# Patient Record
Sex: Female | Born: 2000 | Race: White | Hispanic: No | Smoking: Never smoker
Health system: Southern US, Community
[De-identification: ages and names within clinical notes are randomized; demographics above are authoritative.]

## PROBLEM LIST (undated history)

## (undated) HISTORY — PX: WISDOM TOOTH EXTRACTION: SHX21

---
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
## 2021-01-05 ENCOUNTER — Other Ambulatory Visit: Payer: Self-pay | Admitting: Internal Medicine

## 2021-01-05 DIAGNOSIS — F909 Attention-deficit hyperactivity disorder, unspecified type: Secondary | ICD-10-CM

## 2021-01-05 MED ORDER — LISDEXAMFETAMINE DIMESYLATE 70 MG PO CAPS: ORAL_CAPSULE | ORAL | 0 refills | Status: DC

## 2021-01-12 ENCOUNTER — Telehealth: Payer: Self-pay

## 2021-01-12 NOTE — Telephone Encounter (Signed)
-----   Message from Elder Negus, NP sent at 01/12/2021 12:36 PM EST ----- Regarding: RE: Prior auth Please let patient know.  Sincerely,           Elder Negus, NP   ----- Message ----- From: Gregery Na, CMA Sent: 01/12/2021  12:01 PM EST To: Elder Negus, NP Subject: Prior Berkley Harvey                                     P/A for [facility] has been DENIED/ [employer] Reference #: [id number]

## 2021-01-12 NOTE — Telephone Encounter (Signed)
Patient does not have a VM box set up at this time. Unable to inform pt that insurance will not cover VYVANSE/ P/A---denied. Unable to LVM to have patient return call as well

## 2021-01-15 DIAGNOSIS — Z118 Encounter for screening for other infectious and parasitic diseases: Secondary | ICD-10-CM | POA: Diagnosis not present

## 2021-01-15 DIAGNOSIS — Z6821 Body mass index (BMI) 21.0-21.9, adult: Secondary | ICD-10-CM | POA: Diagnosis not present

## 2021-01-15 DIAGNOSIS — R350 Frequency of micturition: Secondary | ICD-10-CM | POA: Diagnosis not present

## 2021-01-15 DIAGNOSIS — Z01419 Encounter for gynecological examination (general) (routine) without abnormal findings: Secondary | ICD-10-CM | POA: Diagnosis not present

## 2021-01-29 ENCOUNTER — Other Ambulatory Visit (HOSPITAL_BASED_OUTPATIENT_CLINIC_OR_DEPARTMENT_OTHER): Payer: Self-pay | Admitting: Nurse Practitioner

## 2021-01-29 DIAGNOSIS — N3000 Acute cystitis without hematuria: Secondary | ICD-10-CM

## 2021-01-29 MED ORDER — FLUCONAZOLE 150 MG PO TABS
150.0000 mg | ORAL_TABLET | Freq: Every day | ORAL | 1 refills | Status: DC
Start: 2021-01-29 — End: 2021-03-12

## 2021-01-29 MED ORDER — NITROFURANTOIN MONOHYD MACRO 100 MG PO CAPS: 100.0000 mg | ORAL_CAPSULE | Freq: Two times a day (BID) | ORAL | 0 refills | Status: DC

## 2021-01-30 ENCOUNTER — Telehealth: Payer: Self-pay

## 2021-01-30 NOTE — Telephone Encounter (Signed)
-----  Message from Liane Comber, NP sent at 01/29/2021  6:03 PM EST ----- Regarding: RE: phone message Please advise macrobid and diflucan were sent in by Jacolyn Reedy, NP - [facility] on 01/29/2021. Should be able to pick up anytime at her convenience.   ----- Message ----- From: Elenor Quinones, CMA Sent: 01/29/2021   4:02 PM EST To: Liane Comber, NP, Chancy Hurter, CMA Subject: phone message                                  Used at home UTI kit which was POS.  Last seen 12/23/2020--ash cor   Today visit with Teresa EARLY,NP- [facility]- for ACUTE CYSTITIS-------(FYI) Saw this is computer   Patient has requested Rx sent to pharmacy.  Next office visit: 06/10/2021 was w/ K.M

## 2021-01-30 NOTE — Telephone Encounter (Signed)
Pt has picked up meds

## 2021-03-11 NOTE — Progress Notes (Signed)
Assessment and Plan:  Teresa Olson was seen today for medication management.  Diagnoses and all orders for this visit:  Labile mood Depression, unspecified depression type Family history of bipolar disorder With increased labile mood with lexapro, insomnia Strong family history of bipolar is shared today, suspect this may be the case for her After discussion, taper off of lexapro with 5 mg x 1 week then stop Then trial vraylar 1.5 mg daily x 2 weeks and follow up , coupon given  Stress management techniques discussed, increase water, good sleep hygiene discussed, increase exercise, and increase veggies.  Call the office if any new AE's from medications and we will switch them -     cariprazine (VRAYLAR) capsule; Take 1 capsule (1.5 mg total) by mouth daily.  Further disposition pending results of labs. Discussed med's effects and SE's.   Over 15 minutes of exam, counseling, chart review, and critical decision making was performed.   Future Appointments  Date Time Provider Department Center  05/12/2021  4:00 PM Judd Gaudier, NP [facility] None  06/10/2021  3:30 PM Elder Negus, NP [facility] None  12/10/2021  2:00 PM McClanahan, Bella Kennedy, NP [facility] None    ------------------------------------------------------------------------------------------------------------------   HPI BP 124/72   Pulse (!) 134   Temp (!) 97.3 F (36.3 C)   Wt 136 lb (61.7 kg)   SpO2 94%   BMI 22.63 kg/m   19 y.o.female presents for follow up of depression.    She was evaluated for depressed mood and poor motivation 2 months ago, ongoing for many years, but worse with the college she was out (currently at [employer], studying radiography), and also when grandmother passed this past year. Parents are not supportive, treats like "out of sight, out of mind." She came in with her girlfriend now that she is 39 y/o. Initated on lexapro 10 mg, reports immediately had sedation and started taking at night, but then  noted after 2 weeks noted she was having trouble sleeping, has been waking up hourly. Hasn't noted any improvement with mood. Notes very labile, "all over the place." Will very quickly snap and make statements she doesn't mean.   She notes today sister is bipolar, reports strong family hx.   Denies ETOH, drugs Has had some SI without specific plan; girlfriend spends extensive time with her and monitoring closely  Recently had normal labs at [facility] 12/10/2020, CBC, CMP, TSH  She has been on vyvanse 70 mg daily for ADD, works well, unchanged.   Patient is on Vitamin D supplement.   Lab Results  Component Value Date   VD25OH 46 12/25/2019       No past medical history on file.   Allergies  Allergen Reactions  . Wound Dressing Adhesive Rash  . Sulfa Antibiotics     Current Outpatient Medications on File Prior to Visit  Medication Sig  . BLISOVI 24 FE 1-20 MG-MCG(24) tablet   . Cholecalciferol (VITAMIN D3) 125 MCG (5000 UT) CAPS Takes 1 capsule Daily  . Cyanocobalamin (VITAMIN B-12) 1000 MCG SUBL Takes 1 tablet SL Daily  . fexofenadine (ALLEGRA) 180 MG tablet Take 180 mg by mouth daily.  Marland Kitchen lisdexamfetamine (VYVANSE) 70 MG capsule Take  1 capsule    in the Mornings of Work or School Days for ADD, Focus & Concentration & try limit to 5 days  /week to avoid Addiction  . nitrofurantoin, macrocrystal-monohydrate, (MACROBID) 100 MG capsule Take 1 capsule (100 mg total) by mouth 2 (two) times daily.   No current facility-administered  medications on file prior to visit.    ROS: all negative except above.   Physical Exam:  BP 124/72   Pulse (!) 134   Temp (!) 97.3 F (36.3 C)   Wt 136 lb (61.7 kg)   SpO2 94%   BMI 22.63 kg/m   General Appearance: Well nourished, in no apparent distress. Eyes: PERRL, conjunctiva no swelling or erythema ENT/Mouth: Mask in place; Hearing normal.  Neck: Supple, thyroid  Respiratory: Respiratory effort normal Cardio:  Appears well  perfused Musculoskeletal:  normal gait.  Skin: Warm, dry without rashes, lesions, ecchymosis.  Neuro: Normal muscle tone Psych: Awake and oriented X 3, hyper, fidgeting, mildly pressured speech, Insight and Judgment appropriate.     Dan Maker, NP 5:49 PM [facility]

## 2021-03-12 ENCOUNTER — Encounter: Payer: Self-pay | Admitting: Adult Health

## 2021-03-12 ENCOUNTER — Other Ambulatory Visit: Payer: Self-pay

## 2021-03-12 ENCOUNTER — Ambulatory Visit: Payer: BC Managed Care – PPO | Admitting: Adult Health

## 2021-03-12 VITALS — BP 124/72 | HR 134 | Temp 97.3°F | Wt 136.0 lb

## 2021-03-12 DIAGNOSIS — Z6821 Body mass index (BMI) 21.0-21.9, adult: Secondary | ICD-10-CM

## 2021-03-12 DIAGNOSIS — F32A Depression, unspecified: Secondary | ICD-10-CM | POA: Diagnosis not present

## 2021-03-12 DIAGNOSIS — R4586 Emotional lability: Secondary | ICD-10-CM

## 2021-03-12 DIAGNOSIS — E559 Vitamin D deficiency, unspecified: Secondary | ICD-10-CM

## 2021-03-12 DIAGNOSIS — F909 Attention-deficit hyperactivity disorder, unspecified type: Secondary | ICD-10-CM

## 2021-03-12 DIAGNOSIS — Z818 Family history of other mental and behavioral disorders: Secondary | ICD-10-CM | POA: Diagnosis not present

## 2021-03-12 DIAGNOSIS — F321 Major depressive disorder, single episode, moderate: Secondary | ICD-10-CM

## 2021-03-12 MED ORDER — CARIPRAZINE HCL 1.5 MG PO CAPS: 1.5000 mg | ORAL_CAPSULE | Freq: Every day | ORAL | 0 refills | Status: DC

## 2021-03-12 NOTE — Patient Instructions (Addendum)
Mixed Bipolar Disorder Mixed bipolar disorder is a mental health disorder in which a person has episodes of emotional highs (mania), lows (depression), or both of these feelings at the same time. People with this disorder have very big mood changes (mood swings) that happen quickly on a regular basis. These episodes may be severe enough to cause problems with relationships, school, or work. In some cases, they can cause the person to be unsafe, and the person may need to be hospitalized. What are the causes? The cause of this condition is not known. What increases the risk? The following factors may make you more likely to develop this condition:  Having a family history of the disorder.  Abusing substances such as alcohol or drugs.  Having an anxiety disorder.  Having another illness, such as heart disease or thyroid disease. What are the signs or symptoms? Symptoms of this condition include having episodes of mania, depression, and sometimes symptoms of both at the same time. For instance, you may feel sad and full of energy at the same time. You may have mood swings almost every day. Symptoms of mania may include:  Very high self-esteem or self-confidence.  Being unusually talkative, or feeling a need to keep talking. Speech may be very fast. It may seem like you cannot stop talking.  Racing thoughts or constant talking, with quick shifts between topics that may or may not be related (flight of ideas).  Decreased ability to focus or concentrate.  Increased purposeful activity, such as work, study, or social activity.  Increased nonproductive activity. This could be pacing, squirming and fidgeting, or finger and toe tapping.  Impulsive behavior and poor judgment. This may result in high-risk activities, such as having unprotected sex or spending a lot of money. Symptoms of depression may include:  Feeling sad, hopeless, or helpless.  Lack of feeling or caring about  anything.  Not being able to enjoy things that you used to enjoy.  Trouble concentrating or remembering.  Trouble making decisions.  Thoughts of death, or desire to harm yourself. How is this diagnosed? This condition may be diagnosed based on:  Your symptoms and medical history. Your health care provider will ask about your emotional episodes.  A physical exam. This is done to rule out any health problems that may be causing symptoms. Your health care provider will also ask about your alcohol and drug use. How is this treated? Bipolar disorder is a long-term (chronic) illness. It is best controlled with treatment that is given on an ongoing basis, rather than only when symptoms are present. Treatment may include:  Medicine. Medicine can be prescribed by a provider who specializes in treating mental disorders (psychiatrist). ? Medicines called mood stabilizers, antipsychotics, or antidepressants may be prescribed. ? If symptoms occur even while one type of medicine is taken, other medicines may be added.  Psychotherapy. Some forms of talk therapy, such as cognitive behavioral therapy (CBT), can provide support, education, and guidance.  Coping methods, such as journaling or relaxation exercises. These may include: ? Yoga. ? Meditation. ? Deep breathing.  Lifestyle changes, such as: ? Limiting alcohol and drug use. ? Exercising regularly. ? Getting plenty of sleep. ? Making healthy eating choices. A combination of medicine, talk therapy, and coping methods is best. In severe cases, if other treatments do not work, a procedure may be used to change the brain chemicals that send messages between brain cells (neurotransmitters). This procedure, called electroconvulsive therapy (ECT), applies short electrical pulses to  the brain through the scalp.   Follow these instructions at home: Activity  Return to your normal activities as told by your health care provider.  Find activities  that you enjoy, and make time to do them.  Exercise regularly as told by your health care provider.   Lifestyle  Follow a set schedule for eating and sleeping.  Eat a balanced diet that includes fresh fruits and vegetables, whole grains, low-fat dairy products, and lean meats.  Get 7-8 or more hours of sleep each night.  Do not drink alcohol or use illegal drugs. General instructions  Take over-the-counter and prescription medicines only as told by your health care provider.  Think about joining a support group. Your health care provider may be able to recommend a group for you.  Talk with your family and loved ones about your treatment goals and about how they can help.  Keep all follow-up visits as told by your health care provider. This is important. Contact a health care provider if:  Your symptoms get worse.  You have side effects from your medicine.  You have trouble sleeping.  You have trouble doing daily activities.  You feel unsafe in your surroundings.  You are dealing with substance abuse. Get help right away if:  You think about hurting yourself or you try to hurt yourself.  You think about suicide. If you ever feel like you may hurt yourself or others, or have thoughts about taking your own life, get help right away. You can go to your nearest emergency department or call:  Your local emergency services (911 in the [place]  A suicide crisis helpline, such as the National Suicide Prevention Lifeline at (405)801-02591-(647)586-2124. This is open 24 hours a day. Summary  Mixed bipolar disorder is a mental health disorder in which a person has episodes of emotional highs (mania), lows (depression), or both of these feelings at the same time.  Bipolar disorder is a long-term (chronic) illness. It is best controlled with treatment that is given on an ongoing basis, rather than only when symptoms are present.  A combination of medicine, talk therapy, and coping methods is  best for treating this condition. This information is not intended to replace advice given to you by your health care provider. Make sure you discuss any questions you have with your health care provider. Document Revised: 05/14/2020 Document Reviewed: 05/14/2020 Elsevier Patient Education  2021 Elsevier Inc.         Cariprazine Oral Capsules What is this medicine? CARIPRAZINE (car i PRA zeen) is an antipsychotic. It is used to treat schizophrenia or bipolar disorder. Bipolar disorder is also known as manic-depression. This medicine may be used for other purposes; ask your health care provider or pharmacist if you have questions. COMMON BRAND NAME(S): VRAYLAR What should I tell my health care provider before I take this medicine? They need to know if you have any of these conditions:  dementia  diabetes  difficulty swallowing  have trouble controlling your muscles  heart disease  high cholesterol  history of breast cancer  history of stroke  kidney disease  liver disease  low blood counts, like low white cell, platelet, or red cell counts  low blood pressure  Parkinson's disease  seizures  suicidal thoughts, plans or attempt; a previous suicide attempt by you or a family member  an unusual or allergic reaction to cariprazine, other medicines, foods, dyes, or preservatives  pregnant or trying to get pregnant  breast-feeding How should I  use this medicine? Take this medicine by mouth with a glass of water. Follow the directions on the prescription label. You may take it with or without food. Take your medicine at regular intervals. Do not take it more often than directed. Do not stop taking except on your doctor's advice. A special MedGuide will be given to you by the pharmacist with each prescription and refill. Be sure to read this information carefully each time. Talk to your pediatrician regarding the use of this medicine in children. Special care may be  needed. Overdosage: If you think you have taken too much of this medicine contact a poison control center or emergency room at once. NOTE: This medicine is only for you. Do not share this medicine with others. What if I miss a dose? If you miss a dose, take it as soon as you can. If it is almost time for your next dose, take only that dose. Do not take double or extra doses. What may interact with this medicine? Do not take this medicine with any of the following medications:  metoclopramide This medicine may also interact with the following medications:  antihistamines for allergy, cough, and cold  carbamazepine  certain medicines for anxiety or sleep  certain medicines for depression like amitriptyline, fluoxetine, sertraline  certain medicines for fungal infections like itraconazole, ketoconazole  general anesthetics like halothane, isoflurane, methoxyflurane, propofol  levodopa or other medicines for Parkinson's disease  medicines for blood pressure  medicines for seizures  medicines that relax muscles for surgery  narcotic medicines for pain  phenothiazines like chlorpromazine, prochlorperazine, thioridazine  rifampin This list may not describe all possible interactions. Give your health care provider a list of all the medicines, herbs, non-prescription drugs, or dietary supplements you use. Also tell them if you smoke, drink alcohol, or use illegal drugs. Some items may interact with your medicine. What should I watch for while using this medicine? Visit your health care professional for regular checks on your progress. Tell your health care professional if symptoms do not start to get better or if they get worse. Do not stop taking except on your health care professional's advice. You may develop a severe reaction. Your health care professional will tell you how much medicine to take. Patients and their families should watch out for new or worsening depression or  thoughts of suicide. Also watch out for sudden changes in feelings such as feeling anxious, agitated, panicky, irritable, hostile, aggressive, impulsive, severely restless, overly excited and hyperactive, or not being able to sleep. If this happens, especially at the beginning of treatment or after a change in dose, call your healthcare professional. Bonita Quin may get dizzy or drowsy. Do not drive, use machinery, or do anything that needs mental alertness until you know how this medicine affects you. Do not stand or sit up quickly, especially if you are an older patient. This reduces the risk of dizzy or fainting spells. Alcohol may interfere with the effect of this medicine. Avoid alcoholic drinks. This medicine may cause dry eyes and blurred vision. If you wear contact lenses you may feel some discomfort. Lubricating drops may help. See your eye doctor if the problem does not go away or is severe. This medicine may increase blood sugar. Ask your health care provider if changes in diet or medicines are needed if you have diabetes. This drug can cause problems with controlling your body temperature. It can lower the response of your body to cold temperatures. If possible, stay indoors  during cold weather. If you must go outdoors, wear warm clothes. It can also lower the response of your body to heat. Do not overheat. Do not over-exercise. Stay out of the sun when possible. If you must be in the sun, wear cool clothing. Drink plenty of water. If you have trouble controlling your body temperature, call your health care provider right away. Women should inform their doctor if they wish to become pregnant or think they might be pregnant. The effects of this medicine on an unborn child are not known. A registry is available to monitor pregnancy outcomes in pregnant women exposed to this medicine or similar medicines. Talk to your health care professional or pharmacist for more information. What side effects may I notice  from receiving this medicine? Side effects that you should report to your doctor or health care professional as soon as possible:  allergic reactions like skin rash, itching or hives, swelling of the face, lips, or tongue  breathing problems  confusion  fever or chills, sore throat  inability to keep still  problems with balance, talking, walking  redness, blistering, peeling or loosening of the skin, including inside the mouth  seizures  signs and symptoms of high blood sugar such as being more thirsty or hungry or having to urinate more than normal. You may also feel very tired or have blurry vision  signs and symptoms of low blood pressure like dizziness; feeling faint or lightheaded, falls; unusually weak or tired  signs and symptoms of neuroleptic malignant syndrome such as confusion; fast or irregular heartbeat; high fever; increased sweating; stiff muscles  sudden numbness or weakness of the face, arm, or leg  suicidal thoughts or other mood changes  trouble swallowing  uncontrollable movements of the arms, face, head, mouth, neck, or upper body Side effects that usually do not require medical attention (report to your doctor or health care professional if they continue or are bothersome):  constipation  drowsiness  nausea, vomiting  upset stomach  weight gain This list may not describe all possible side effects. Call your doctor for medical advice about side effects. You may report side effects to FDA at 1-800-FDA-1088. Where should I keep my medicine? Keep out of the reach of children. Store at room temperature between 15 and 30 degrees C (59 and 86 degrees F). Protect from light. Throw away any unused medicine after the expiration date. NOTE: This sheet is a summary. It may not cover all possible information. If you have questions about this medicine, talk to your doctor, pharmacist, or health care provider.  2021 Elsevier/Gold Standard (2019-10-11  15:13:10)

## 2021-03-17 ENCOUNTER — Other Ambulatory Visit: Payer: Self-pay | Admitting: Adult Health

## 2021-03-17 MED ORDER — FLUOXETINE HCL 10 MG PO CAPS
10.0000 mg | ORAL_CAPSULE | Freq: Every day | ORAL | 0 refills | Status: DC
Start: 2021-03-17 — End: 2021-04-10

## 2021-03-19 ENCOUNTER — Other Ambulatory Visit: Payer: Self-pay

## 2021-03-19 DIAGNOSIS — F909 Attention-deficit hyperactivity disorder, unspecified type: Secondary | ICD-10-CM

## 2021-03-20 MED ORDER — LISDEXAMFETAMINE DIMESYLATE 70 MG PO CAPS: ORAL_CAPSULE | ORAL | 0 refills | Status: DC

## 2021-03-25 ENCOUNTER — Other Ambulatory Visit: Payer: Self-pay | Admitting: Adult Health

## 2021-03-31 ENCOUNTER — Other Ambulatory Visit: Payer: Self-pay | Admitting: Adult Health

## 2021-03-31 DIAGNOSIS — F909 Attention-deficit hyperactivity disorder, unspecified type: Secondary | ICD-10-CM

## 2021-03-31 MED ORDER — LISDEXAMFETAMINE DIMESYLATE 70 MG PO CAPS: ORAL_CAPSULE | ORAL | 0 refills | Status: DC

## 2021-03-31 MED ORDER — METHYLPHENIDATE HCL ER (OSM) 27 MG PO TBCR: 27.0000 mg | EXTENDED_RELEASE_TABLET | Freq: Every day | ORAL | 0 refills | Status: DC | PRN

## 2021-03-31 NOTE — Progress Notes (Signed)
Future Appointments  Date Time Provider Department Center  05/12/2021  4:00 PM Judd Gaudier, NP [facility] None  06/10/2021  3:30 PM Elder Negus, NP [facility] None  12/10/2021  2:00 PM Elder Negus, NP [facility] None   PDMP reviewed for vyvanse refill request.

## 2021-04-09 ENCOUNTER — Ambulatory Visit: Payer: BC Managed Care – PPO

## 2021-04-10 ENCOUNTER — Other Ambulatory Visit: Payer: Self-pay | Admitting: Adult Health

## 2021-04-30 ENCOUNTER — Other Ambulatory Visit: Payer: Self-pay | Admitting: Adult Health

## 2021-04-30 MED ORDER — FLUOXETINE HCL 20 MG PO CAPS: 20.0000 mg | ORAL_CAPSULE | Freq: Every day | ORAL | 1 refills | Status: DC

## 2021-05-01 ENCOUNTER — Other Ambulatory Visit: Payer: Self-pay

## 2021-05-01 DIAGNOSIS — F909 Attention-deficit hyperactivity disorder, unspecified type: Secondary | ICD-10-CM

## 2021-05-02 ENCOUNTER — Other Ambulatory Visit: Payer: Self-pay | Admitting: Adult Health

## 2021-05-02 DIAGNOSIS — F909 Attention-deficit hyperactivity disorder, unspecified type: Secondary | ICD-10-CM

## 2021-05-02 MED ORDER — FLUOXETINE HCL 10 MG PO CAPS: 10.0000 mg | ORAL_CAPSULE | Freq: Every day | ORAL | 1 refills | Status: DC

## 2021-05-02 MED ORDER — METHYLPHENIDATE HCL ER (OSM) 36 MG PO TBCR: 36.0000 mg | EXTENDED_RELEASE_TABLET | Freq: Every day | ORAL | 0 refills | Status: DC | PRN

## 2021-05-02 NOTE — Progress Notes (Signed)
Future Appointments  Date Time Provider Department Center  05/12/2021  4:00 PM Judd Gaudier, NP [facility] None  06/10/2021  3:30 PM Elder Negus, NP [facility] None  12/10/2021  2:00 PM Elder Negus, NP [facility] None    PDMP reviewed for concerta refill request.

## 2021-05-08 ENCOUNTER — Other Ambulatory Visit (HOSPITAL_BASED_OUTPATIENT_CLINIC_OR_DEPARTMENT_OTHER): Payer: Self-pay | Admitting: Nurse Practitioner

## 2021-05-08 DIAGNOSIS — B3731 Acute candidiasis of vulva and vagina: Secondary | ICD-10-CM

## 2021-05-08 DIAGNOSIS — B373 Candidiasis of vulva and vagina: Secondary | ICD-10-CM

## 2021-05-08 MED ORDER — FLUCONAZOLE 150 MG PO TABS: ORAL_TABLET | ORAL | 1 refills | Status: DC

## 2021-05-08 NOTE — Progress Notes (Signed)
Assessment and Plan:  Teresa Olson was seen today for follow-up.  Diagnoses and all orders for this visit:  Attention deficit hyperactivity disorder (ADHD), unspecified ADHD type Continue medications: concerta 36 mg daily Helps with focus, no AE's. The patient was counseled on the addictive nature of the medication and was encouraged to take drug holidays when not needed.   Depression, unspecified depression type Will try next dose up to see benefit 10 mg vs 20 mg; send in higher dose if perceives benefit and tolerating well.  Lifestyle discussed: diet/exerise, sleep hygiene, stress management, hydration -     FLUoxetine (PROZAC) 20 MG capsule; Take 1 capsule (20 mg total) by mouth daily.   Further disposition pending results of labs. Discussed med's effects and SE's.   Over 15 minutes of exam, counseling, chart review, and critical decision making was performed.   Future Appointments  Date Time Provider Department Center  12/10/2021  2:00 PM McClanahan, Bella Kennedy, NP [facility] None    ------------------------------------------------------------------------------------------------------------------   HPI BP 118/60   Pulse 93   Temp (!) 97.5 F (36.4 C)   Wt 143 lb (64.9 kg)   SpO2 99%   BMI 23.80 kg/m   20 y.o.female presents for follow up of depression and ADD.    She was evaluated for depressed mood and poor motivation in early 2022, ongoing for many years, but worse with the college she was out (currently at [employer], studying radiography, doing prereq), and also when grandmother passed in 2021. Parents are not supportive, treats like "out of sight, out of mind." She came in with her girlfriend now that she is 20 y/o. Initated on lexapro 10 mg, reports immediately had sedation/insomnia, switched to prozac 10 mg and today she reports much improved mood for the most part, less labile though does still have some down days, denies SE.   Also previously on vyvanse 70 mg, was swtiched to  concerta due to insurance coverage issue, now up to 36 mg daily, reports works very well.   Denies ETOH, drugs Denies recent SI.   BMI is Body mass index is 23.8 kg/m., she has been working on diet and exercise, does "just dance" 30 min a few days a week.  Does try to make good choices, but difficult when at dad's house, lots of processed.  Wt Readings from Last 3 Encounters:  05/12/21 143 lb (64.9 kg) (76 %, Z= 0.70)*  03/12/21 136 lb (61.7 kg) (68 %, Z= 0.46)*  12/23/20 131 lb (59.4 kg) (61 %, Z= 0.28)*   * Growth percentiles are based on CDC (Girls, 2-20 Years) data.   Patient is on Vitamin D supplement.   Lab Results  Component Value Date   VD25OH 46 12/25/2019     Recently had normal labs at [facility] 12/10/2020, CBC, CMP, TSH    No past medical history on file.   Allergies  Allergen Reactions  . Wound Dressing Adhesive Rash  . Sulfa Antibiotics     Current Outpatient Medications on File Prior to Visit  Medication Sig  . BLISOVI 24 FE 1-20 MG-MCG(24) tablet   . Cyanocobalamin (VITAMIN B-12) 1000 MCG SUBL Takes 1 tablet SL Daily  . fexofenadine (ALLEGRA) 180 MG tablet Take 180 mg by mouth daily.  . methylphenidate (CONCERTA) 36 MG PO CR tablet Take 1 tablet (36 mg total) by mouth daily as needed. For mood and focus. Try to limit to <5 days/week avoid tolerance.  . Cholecalciferol (VITAMIN D3) 125 MCG (5000 UT) CAPS Takes 1 capsule  Daily (Patient not taking: 19/31/2022)   No current facility-administered medications on file prior to visit.    ROS: all negative except above.   Physical Exam:  BP 118/60   Pulse 93   Temp (!) 97.5 F (36.4 C)   Wt 143 lb (64.9 kg)   SpO2 99%   BMI 23.80 kg/m   General Appearance: Well nourished, in no apparent distress. Eyes: PERRL, conjunctiva no swelling or erythema ENT/Mouth: Mask in place; Hearing normal.  Neck: Supple, thyroid  Respiratory: Respiratory effort normal Cardio:  Appears well perfused Musculoskeletal:   normal gait.  Skin: Warm, dry without rashes, lesions, ecchymosis.  Neuro: Normal muscle tone Psych: Awake and oriented X 3, pleasant, normal affect, Insight and Judgment appropriate.     Dan Maker, NP 4:42 PM [facility]

## 2021-05-12 ENCOUNTER — Ambulatory Visit: Payer: BC Managed Care – PPO | Admitting: Adult Health

## 2021-05-12 ENCOUNTER — Other Ambulatory Visit: Payer: Self-pay

## 2021-05-12 ENCOUNTER — Encounter: Payer: Self-pay | Admitting: Adult Health

## 2021-05-12 VITALS — BP 118/60 | HR 93 | Temp 97.5°F | Wt 143.0 lb

## 2021-05-12 DIAGNOSIS — Z79899 Other long term (current) drug therapy: Secondary | ICD-10-CM | POA: Diagnosis not present

## 2021-05-12 DIAGNOSIS — F909 Attention-deficit hyperactivity disorder, unspecified type: Secondary | ICD-10-CM | POA: Diagnosis not present

## 2021-05-12 DIAGNOSIS — F32A Depression, unspecified: Secondary | ICD-10-CM

## 2021-05-12 DIAGNOSIS — E559 Vitamin D deficiency, unspecified: Secondary | ICD-10-CM

## 2021-05-12 MED ORDER — FLUOXETINE HCL 20 MG PO CAPS: 20.0000 mg | ORAL_CAPSULE | Freq: Every day | ORAL | 1 refills | Status: DC

## 2021-05-26 ENCOUNTER — Other Ambulatory Visit: Payer: Self-pay

## 2021-05-26 DIAGNOSIS — F909 Attention-deficit hyperactivity disorder, unspecified type: Secondary | ICD-10-CM

## 2021-05-26 MED ORDER — METHYLPHENIDATE HCL ER (OSM) 36 MG PO TBCR: 36.0000 mg | EXTENDED_RELEASE_TABLET | Freq: Every day | ORAL | 0 refills | Status: DC | PRN

## 2021-06-10 ENCOUNTER — Ambulatory Visit: Payer: BC Managed Care – PPO | Admitting: Adult Health Nurse Practitioner

## 2021-06-29 ENCOUNTER — Other Ambulatory Visit: Payer: Self-pay

## 2021-06-29 DIAGNOSIS — F909 Attention-deficit hyperactivity disorder, unspecified type: Secondary | ICD-10-CM

## 2021-06-30 MED ORDER — METHYLPHENIDATE HCL ER (OSM) 36 MG PO TBCR: 36.0000 mg | EXTENDED_RELEASE_TABLET | Freq: Every day | ORAL | 0 refills | Status: DC | PRN

## 2021-09-03 ENCOUNTER — Other Ambulatory Visit: Payer: Self-pay

## 2021-09-03 DIAGNOSIS — F909 Attention-deficit hyperactivity disorder, unspecified type: Secondary | ICD-10-CM

## 2021-09-04 MED ORDER — METHYLPHENIDATE HCL ER (OSM) 36 MG PO TBCR: 36.0000 mg | EXTENDED_RELEASE_TABLET | Freq: Every day | ORAL | 0 refills | Status: DC | PRN

## 2021-11-10 ENCOUNTER — Encounter: Payer: Self-pay | Admitting: Adult Health

## 2021-11-10 ENCOUNTER — Other Ambulatory Visit: Payer: Self-pay | Admitting: Adult Health

## 2021-11-10 DIAGNOSIS — F32A Depression, unspecified: Secondary | ICD-10-CM

## 2021-11-10 MED ORDER — FLUOXETINE HCL 20 MG PO CAPS: 20.0000 mg | ORAL_CAPSULE | Freq: Every day | ORAL | 1 refills | Status: DC

## 2021-11-23 ENCOUNTER — Other Ambulatory Visit: Payer: Self-pay | Admitting: Nurse Practitioner

## 2021-11-23 DIAGNOSIS — F909 Attention-deficit hyperactivity disorder, unspecified type: Secondary | ICD-10-CM

## 2021-11-24 MED ORDER — METHYLPHENIDATE HCL ER (OSM) 36 MG PO TBCR: 36.0000 mg | EXTENDED_RELEASE_TABLET | Freq: Every day | ORAL | 0 refills | Status: DC | PRN

## 2021-12-09 NOTE — Progress Notes (Deleted)
COMPLETE PHSYICAL   Assessment and Plan:  Encounter for general adult medical examination with abnormal findings Yearly  Attention deficit hyperactivity disorder (ADHD), unspecified ADHD type -Vyvance 70mg   Coupon card given to patient and discussed.  Vitamin D deficiency Continue supplementation to maintain goal of 70-100 Taking Vitamin D 5,000IU daily -     VITAMIN D 25 Hydroxy (Vit-D Deficiency, Fractures)  Raynaud's disease without gangrene Stay warm, can use cream as needed Call if worsening symptoms or new symptoms.   Medication management - Magnesium  Hyperlipidemia Continue diet and exercise -     Lipid panel - CBC - CMP  Screening for hematuria or proteinuria -     Urinalysis, Routine w reflex microscopic -     Microalbumin / creatinine urine ratio  Abnormal Glucose Continue diet and exercise -     Hemoglobin A1c     Discussed med's effects and SE's. Screening labs and tests as requested with regular follow-up as recommended. Over 40 minutes of face to face interview,  exam, counseling, chart review, and complex, high level critical decision making was performed this visit.   HPI  20 y.o. female  Presents for complete physical and for has Raynaud's disease without gangrene; Attention deficit hyperactivity disorder (ADHD); Vitamin D deficiency; Medication management; and Depressed on their problem list..  Her blood pressure has been controlled at home, today their BP is   She does not workout. She denies chest pain, shortness of breath, dizziness.   She is in 12th grade this year, does not want to go to college, but will go, likes music, does not know what she wants.    She follows with GYN and is on BCP for irregular menses, she sexually active.  One partner, female. Reports use of external / internal stimulators. Discussed hygiene.  She gets hives for years, take zyrtec which helps. She has DX of raynauds, has not had ulcers or rash on her feet lately.  Has not had to use the NTG given.   BMI is There is no height or weight on file to calculate BMI., she is working on diet and exercise. Wt Readings from Last 3 Encounters:  05/12/21 143 lb (64.9 kg) (76 %, Z= 0.70)*  03/12/21 136 lb (61.7 kg) (68 %, Z= 0.46)*  12/23/20 131 lb (59.4 kg) (61 %, Z= 0.28)*   * Growth percentiles are based on CDC (Girls, 2-20 Years) data.     Current Medications:  Current Outpatient Medications on File Prior to Visit  Medication Sig Dispense Refill   BLISOVI 24 FE 1-20 MG-MCG(24) tablet   9   Cholecalciferol (VITAMIN D3) 125 MCG (5000 UT) CAPS Takes 1 capsule Daily (Patient not taking: Reported on 05/12/2021) 1 capsule 0   Cyanocobalamin (VITAMIN B-12) 1000 MCG SUBL Takes 1 tablet SL Daily 1 tablet 0   fexofenadine (ALLEGRA) 180 MG tablet Take 180 mg by mouth daily.     FLUoxetine (PROZAC) 20 MG capsule Take 1 capsule (20 mg total) by mouth daily. 90 capsule 1   methylphenidate (CONCERTA) 36 MG PO CR tablet Take 1 tablet (36 mg total) by mouth daily as needed. For mood and focus. Try to limit to <5 days/week avoid tolerance. 30 tablet 0   No current facility-administered medications on file prior to visit.   Allergies:  Allergies  Allergen Reactions   Wound Dressing Adhesive Rash   Sulfa Antibiotics    Medical History:  She has Raynaud's disease without gangrene; Attention deficit hyperactivity disorder (ADHD);  Vitamin D deficiency; Medication management; and Depressed on their problem list.   Health Maintenance:   Immunization History  Administered Date(s) Administered   HPV 9-valent 10/06/2020   Meningococcal Conjugate 07/31/2019   PFIZER(Purple Top)SARS-COV-2 Vaccination 03/17/2020, 04/09/2020   WILL GET IMMUNIZATION LIST  Tetanus:  Pneumovax: Prevnar 13:  Flu vaccine: Zostavax: HPV: Due for 2nd & 3rd doses, [facility]. Provided written prescription  Patient Care Team: Lucky Cowboy, MD as PCP - General (Internal  Medicine)  Surgical History:  She has no past surgical history on file. Family History:  Herfamily history includes Bipolar disorder in her sister; Cancer in her maternal grandmother; Heart disease in her sister; Rheumatologic disease in her paternal grandfather. Social History:  She reports that she has never smoked. She has never used smokeless tobacco. She reports that she does not drink alcohol and does not use drugs.  Review of Systems: Review of Systems  Constitutional: Negative.   HENT: Negative.    Eyes: Negative.   Respiratory: Negative.    Cardiovascular: Negative.   Gastrointestinal: Negative.   Genitourinary: Negative.   Musculoskeletal:  Positive for myalgias (bilateral toes). Negative for back pain, falls, joint pain and neck pain.  Skin: Negative.   Neurological: Negative.   Psychiatric/Behavioral: Negative.     Physical Exam: Estimated body mass index is 23.8 kg/m as calculated from the following:   Height as of 12/10/20: 5\' 5"  (1.651 m).   Weight as of 05/12/21: 143 lb (64.9 kg). There were no vitals taken for this visit. General Appearance: Well nourished, in no apparent distress.  Eyes: PERRLA, EOMs, conjunctiva no swelling or erythema, normal fundi and vessels.  Sinuses: No Frontal/maxillary tenderness  ENT/Mouth: Ext aud canals clear, normal light reflex with TMs without erythema, bulging. Good dentition. No erythema, swelling, or exudate on post pharynx. Tonsils not swollen or erythematous. Hearing normal.  Neck: Supple, thyroid normal. No bruits  Respiratory: Respiratory effort normal, BS equal bilaterally without rales, rhonchi, wheezing or stridor.  Cardio: RRR without murmurs, rubs or gallops. Brisk peripheral pulses without edema.  Chest: symmetric, with normal excursions and percussion.  Breasts: Symmetric, without lumps, nipple discharge, retractions.  Abdomen: Soft, nontender, no guarding, rebound, hernias, masses, or organomegaly.  Lymphatics: Non  tender without lymphadenopathy.  Genitourinary:  Musculoskeletal: Full ROM all peripheral extremities,5/5 strength, and normal gait.  Skin:  bilateral toes with purple/blue coloring,, good cap refill, good pulses and good sensation bilaterally. Warm, dry without rashes, lesions, ecchymosis. Neuro: Cranial nerves intact, reflexes equal bilaterally. Normal muscle tone, no cerebellar symptoms. Sensation intact.  Psych: Awake and oriented X 3, normal affect, Insight and Judgment appropriate.   EKG: defer next year AORTA SCAN: defer   Donni Oglesby 05/14/21, NP 15:21 PM [facility]

## 2021-12-10 ENCOUNTER — Encounter: Payer: Self-pay | Admitting: Nurse Practitioner

## 2021-12-13 ENCOUNTER — Other Ambulatory Visit: Payer: Self-pay | Admitting: Nurse Practitioner

## 2021-12-13 DIAGNOSIS — F909 Attention-deficit hyperactivity disorder, unspecified type: Secondary | ICD-10-CM

## 2021-12-15 MED ORDER — METHYLPHENIDATE HCL ER (OSM) 36 MG PO TBCR: 36.0000 mg | EXTENDED_RELEASE_TABLET | Freq: Every day | ORAL | 0 refills | Status: DC | PRN

## 2021-12-19 ENCOUNTER — Encounter: Payer: Self-pay | Admitting: Internal Medicine

## 2021-12-29 NOTE — Progress Notes (Signed)
COMPLETE PHSYICAL   Assessment and Plan:  Encounter for general adult medical examination with abnormal findings Yearly  Attention deficit hyperactivity disorder (ADHD), unspecified ADHD type Currently on Concerta 36 mg and symptoms are well managed  Vitamin D deficiency Continue supplementation to maintain goal of 70-100 Taking Vitamin D 5,000IU daily -     VITAMIN D 25 Hydroxy (Vit-D Deficiency, Fractures)  Raynaud's disease without gangrene Stay warm, can use cream as needed Call if worsening symptoms or new symptoms.   Hyperhidrosis Robinul 1mg  BID Given behavior modification strategies  Medication management - Magnesium  Hyperlipidemia Continue diet and exercise -     Lipid panel - CBC - CMP  Screening for hematuria or proteinuria -     Urinalysis, Routine w reflex microscopic -     Microalbumin / creatinine urine ratio  Abnormal Glucose Continue diet and exercise -     Hemoglobin A1c  Overweight Long discussion about weight loss, diet, and exercise Recommended diet heavy in fruits and veggies and low in animal meats, cheeses, and dairy products, appropriate calorie intake Patient will work on increasing exercise and watching carbohydrates and saturated fats. Follow up at next visit    Discussed med's effects and SE's. Screening labs and tests as requested with regular follow-up as recommended. Over 40 minutes of face to face interview,  exam, counseling, chart review, and complex, high level critical decision making was performed this visit.   HPI  21 y.o. female  Presents for complete physical and for has Raynaud's disease without gangrene; Attention deficit hyperactivity disorder (ADHD); Vitamin D deficiency; Medication management; and Depressed on their problem list..  Her blood pressure has been controlled at home, today their BP is BP: 108/60 BP Readings from Last 3 Encounters:  12/31/21 108/60  05/12/21 118/60  03/12/21 124/72    She does not  workout. She denies chest pain, shortness of breath, dizziness.    She has been having issues with excessive sweating.  Palms are nearly always moist and this makes her Raynauds symptoms worse. She has never tried medication.  She is at [employer]. She is currently taking anatomy and physiology to get into the program.   She follows with GYN and is on BCP for irregular menses, she is sexually active.  One partner, female. Reports use of external / internal stimulators. Discussed hygiene.  She gets hives for years, take allegra which helps. She has DX of raynauds, has not had ulcers or rash on her feet lately.   BMI is Body mass index is 25.76 kg/m., she is working on diet and exercise. She is not exercising as much, has bought roller skates but has not been using.  Wt Readings from Last 3 Encounters:  12/31/21 156 lb (70.8 kg) (85 %, Z= 1.05)*  05/12/21 143 lb (64.9 kg) (76 %, Z= 0.70)*  03/12/21 136 lb (61.7 kg) (68 %, Z= 0.46)*   * Growth percentiles are based on CDC (Girls, 2-20 Years) data.     Current Medications:  Current Outpatient Medications on File Prior to Visit  Medication Sig Dispense Refill   BLISOVI 24 FE 1-20 MG-MCG(24) tablet   9   Cholecalciferol (VITAMIN D3) 125 MCG (5000 UT) CAPS Takes 1 capsule Daily 1 capsule 0   Cyanocobalamin (VITAMIN B-12) 1000 MCG SUBL Takes 1 tablet SL Daily 1 tablet 0   fexofenadine (ALLEGRA) 180 MG tablet Take 180 mg by mouth daily.     FLUoxetine (PROZAC) 20 MG capsule Take 1 capsule (  20 mg total) by mouth daily. 90 capsule 1   methylphenidate (CONCERTA) 36 MG PO CR tablet Take 1 tablet (36 mg total) by mouth daily as needed. For mood and focus. Try to limit to <5 days/week avoid tolerance. 30 tablet 0   No current facility-administered medications on file prior to visit.   Allergies:  Allergies  Allergen Reactions   Wound Dressing Adhesive Rash   Sulfa Antibiotics    Medical History:  She has Raynaud's disease  without gangrene; Attention deficit hyperactivity disorder (ADHD); Vitamin D deficiency; Medication management; and Depressed on their problem list.   Health Maintenance:   Immunization History  Administered Date(s) Administered   HPV 9-valent 10/06/2020   Influenza-Unspecified 10/22/2021   Meningococcal Conjugate 07/31/2019   PFIZER(Purple Top)SARS-COV-2 Vaccination 03/17/2020, 04/09/2020   WILL GET IMMUNIZATION LIST  Tetanus:  Pneumovax: Prevnar 13:  Flu vaccine: 10/22/21 Zostavax: HPV: [facility]nd & [facility]rd doses, [facility]. Provided written prescription  Patient Care Team: Unk Pinto, MD as PCP - General (Internal Medicine)  Surgical History:  She has no past surgical history on file. Family History:  Herfamily history includes Bipolar disorder in her sister; Cancer in her maternal grandmother; Heart disease in her sister; Rheumatologic disease in her paternal grandfather. Social History:  She reports that she has never smoked. She has never used smokeless tobacco. She reports that she does not drink alcohol and does not use drugs.  Review of Systems: Review of Systems  Constitutional: Negative.  Negative for chills and fever.  HENT: Negative.  Negative for congestion, hearing loss, sinus pain, sore throat and tinnitus.   Eyes: Negative.  Negative for blurred vision and double vision.  Respiratory: Negative.  Negative for cough, hemoptysis, sputum production, shortness of breath and wheezing.   Cardiovascular: Negative.  Negative for chest pain, palpitations and leg swelling.       Raynauds of hands  Gastrointestinal: Negative.  Negative for abdominal pain, constipation, diarrhea, heartburn, nausea and vomiting.  Genitourinary: Negative.  Negative for dysuria and urgency.  Musculoskeletal:  Negative for back pain, falls, joint pain, myalgias and neck pain.  Skin: Negative.  Negative for rash.       hyperhidrosis  Neurological: Negative.  Negative for dizziness,  tingling, tremors, weakness and headaches.  Endo/Heme/Allergies:  Does not bruise/bleed easily.  Psychiatric/Behavioral: Negative.  Negative for depression and suicidal ideas. The patient is not nervous/anxious and does not have insomnia.    Physical Exam: Estimated body mass index is 25.76 kg/m as calculated from the following:   Height as of this encounter: 5' 5.25" (1.657 m).   Weight as of this encounter: 156 lb (70.8 kg). BP 108/60    Pulse 61    Temp (!) 97.5 F (36.4 C)    Ht 5' 5.25" (1.657 m)    Wt 156 lb (70.8 kg)    LMP 12/26/2021    SpO2 98%    BMI 25.76 kg/m  General Appearance: Well nourished, in no apparent distress.  Eyes: PERRLA, EOMs, conjunctiva no swelling or erythema, normal fundi and vessels.  Sinuses: No Frontal/maxillary tenderness  ENT/Mouth: Ext aud canals clear, normal light reflex with TMs without erythema, bulging. Good dentition. No erythema, swelling, or exudate on post pharynx. Tonsils not swollen or erythematous. Hearing normal.  Neck: Supple, thyroid normal. No bruits  Respiratory: Respiratory effort normal, BS equal bilaterally without rales, rhonchi, wheezing or stridor.  Cardio: RRR without murmurs, rubs or gallops. Brisk peripheral pulses without edema.  Chest: symmetric, with normal  excursions and percussion.  Breasts: Symmetric, without lumps, nipple discharge, retractions.  Abdomen: Soft, nontender, no guarding, rebound, hernias, masses, or organomegaly.  Lymphatics: Non tender without lymphadenopathy.  Genitourinary:  Musculoskeletal: Full ROM all peripheral extremities,5/5 strength, and normal gait.  Skin:  bilateral toes with purple/blue coloring, good cap refill, good pulses and good sensation bilaterally. Clamminess of hands bilaterally Neuro: Cranial nerves intact, reflexes equal bilaterally. Normal muscle tone, no cerebellar symptoms. Sensation intact.  Psych: Awake and oriented X 3, normal affect, Insight and Judgment appropriate.   EKG:  defer  AORTA SCAN: defer   Ranbir Chew Kathyrn Drown, NP 15:21 PM [facility]

## 2021-12-31 ENCOUNTER — Ambulatory Visit (INDEPENDENT_AMBULATORY_CARE_PROVIDER_SITE_OTHER): Payer: BC Managed Care – PPO | Admitting: Nurse Practitioner

## 2021-12-31 ENCOUNTER — Other Ambulatory Visit: Payer: Self-pay

## 2021-12-31 ENCOUNTER — Encounter: Payer: Self-pay | Admitting: Nurse Practitioner

## 2021-12-31 VITALS — BP 108/60 | HR 61 | Temp 97.5°F | Ht 65.25 in | Wt 156.0 lb

## 2021-12-31 DIAGNOSIS — Z79899 Other long term (current) drug therapy: Secondary | ICD-10-CM

## 2021-12-31 DIAGNOSIS — Z0000 Encounter for general adult medical examination without abnormal findings: Secondary | ICD-10-CM | POA: Diagnosis not present

## 2021-12-31 DIAGNOSIS — R61 Generalized hyperhidrosis: Secondary | ICD-10-CM

## 2021-12-31 DIAGNOSIS — F909 Attention-deficit hyperactivity disorder, unspecified type: Secondary | ICD-10-CM

## 2021-12-31 DIAGNOSIS — Z1389 Encounter for screening for other disorder: Secondary | ICD-10-CM | POA: Diagnosis not present

## 2021-12-31 DIAGNOSIS — Z131 Encounter for screening for diabetes mellitus: Secondary | ICD-10-CM

## 2021-12-31 DIAGNOSIS — Z0001 Encounter for general adult medical examination with abnormal findings: Secondary | ICD-10-CM

## 2021-12-31 DIAGNOSIS — E782 Mixed hyperlipidemia: Secondary | ICD-10-CM

## 2021-12-31 DIAGNOSIS — Z13220 Encounter for screening for lipoid disorders: Secondary | ICD-10-CM | POA: Diagnosis not present

## 2021-12-31 DIAGNOSIS — E663 Overweight: Secondary | ICD-10-CM

## 2021-12-31 DIAGNOSIS — Z1329 Encounter for screening for other suspected endocrine disorder: Secondary | ICD-10-CM

## 2021-12-31 DIAGNOSIS — I7300 Raynaud's syndrome without gangrene: Secondary | ICD-10-CM

## 2021-12-31 DIAGNOSIS — E559 Vitamin D deficiency, unspecified: Secondary | ICD-10-CM | POA: Diagnosis not present

## 2021-12-31 DIAGNOSIS — R7309 Other abnormal glucose: Secondary | ICD-10-CM

## 2021-12-31 MED ORDER — GLYCOPYRROLATE 1 MG PO TABS: 1.0000 mg | ORAL_TABLET | Freq: Two times a day (BID) | ORAL | 0 refills | Status: DC | PRN

## 2021-12-31 NOTE — Patient Instructions (Signed)
Hyperhidrosis °Hyperhidrosis is a condition in which the body sweats a lot more than normal (excessively). Sweating is a necessary function for a human body. It is normal to sweat when you are hot, physically active, or anxious. However, hyperhidrosis is sweating to an excessive degree. Although the condition is not a serious one, it can make you feel embarrassed. °There are two kinds of hyperhidrosis: °Primary hyperhidrosis. The sweating usually localizes in one part of your body, such as your underarms, or in a few areas, such as your feet, face, underarms, and hands. This is the more common kind of hyperhidrosis. °Secondary hyperhidrosis. This type usually affects your entire body. °What are the causes? °The cause of this condition depends on the kind of hyperhidrosis that you have. °Primary hyperhidrosis may be caused by sweat glands that are more active than normal. °Secondary hyperhidrosis may be caused by an underlying condition or by taking certain medicines, such as antidepressants or diabetes medicines. Possible conditions that may cause secondary hyperhidrosis include: °Diabetes. °Gout. °Anxiety. °Obesity. °Menopause. °Overactive thyroid (hyperthyroidism). °Tumors. °Frostbite. °Certain types of cancers. °Alcoholism. °Injury to your nervous system. °Stroke. °Parkinson's disease. °What increases the risk? °You are more likely to develop primary hyperhidrosis if you have a family history of the condition. °What are the signs or symptoms? °Symptoms of this condition include: °Feeling like you are sweating constantly, even while you are not being active. °Having skin that peels or gets paler or softer in the areas where you sweat the most. °Being able to see sweat on your skin. °Other symptoms depend on the kind of hyperhidrosis that you have. °Symptoms of primary hyperhidrosis may include: °Sweating in the same location on both sides of your body. °Sweating only during the day and not while you are  sleeping. °Sweating in specific areas, such as your underarms, palms, feet, and face. °Symptoms of secondary hyperhidrosis may include: °Sweating all over your body. °Sweating even while you sleep. °How is this diagnosed? °This condition may be diagnosed by: °Medical history. °Physical exam. °You may also have other tests, including: °Tests to measure the amount of sweat you produce and to show the areas where you sweat the most. These tests may involve: °Using color-changing chemicals to show patterns of sweating on the skin. °Weighing paper that has been applied to the skin. This will show the amount of sweat that your body produces. °Measuring the amount of water that evaporates from the skin. °Using infrared technology to show patterns of sweating on the skin. °Tests to check for other conditions that may be causing excess sweating. This may include blood, urine, or imaging tests. °How is this treated? °Treatment for this condition depends on the kind of hyperhidrosis that you have and the areas of your body that are affected. Your health care provider will also treat any underlying conditions. °Treatment may include: °Medicines, such as:  °Antiperspirants. These are medicines that stop sweat. °Injectable medicines. These may include small injections of botulinum toxin. °Oral medicines. These are taken by mouth to treat underlying conditions and other symptoms. °A procedure to: °Temporarily turn off the sweat glands in your hands and feet (iontophoresis). °Remove your sweat glands. °Cut or destroy the nerves so that they do not send a signal to the sweat glands (sympathectomy). °Follow these instructions at home: °Lifestyle ° °Limit or avoid foods or beverages that may increase your risk of sweating, such as: °Spicy food. °Caffeine. °Alcohol. °Foods that contain monosodium glutamate (MSG). °If your feet sweat: °Wear sandals when possible. °  Do not wear cotton socks. Wear socks that remove or wick moisture from  your feet. °Wear leather shoes. °Avoid wearing the same pair of shoes for two days in a row. °Try placing sweat pads under your clothes to prevent underarm sweat from showing. °Keep a journal of your sweat symptoms and when they occur. This may help you identify things that trigger your sweating. °General instructions °Take over-the-counter and prescription medicines only as told by your health care provider. °Use antiperspirants as told by your health care provider. °Consider joining a hyperhidrosis support group. °Keep all follow-up visits as told by your health care provider. This is important. °Contact a health care provider if: °You have new symptoms. °Your symptoms get worse. °Summary °Hyperhidrosis is a condition in which the body sweats a lot more than normal (excessively). °With primary hyperhidrosis, the sweating usually localizes in one part of your body, such as your underarms, or in a few areas, such as your feet, face, underarms, and hands. It is caused by overactive sweat glands in the affected area. °With secondary hyperhidrosis, the sweating affects your entire body. This is caused by an underlying condition. °Treatment for this condition depends on the kind of hyperhidrosis that you have and the parts of your body that are affected. °This information is not intended to replace advice given to you by your health care provider. Make sure you discuss any questions you have with your health care provider. °Document Revised: 09/24/2020 Document Reviewed: 09/24/2020 °Elsevier Patient Education © 2022 [employer] ° °

## 2022-01-01 LAB — CBC WITH DIFFERENTIAL/PLATELET
Absolute Monocytes: 299 cells/uL (ref 200–950)
Basophils Absolute: 18 cells/uL (ref 0–200)
Basophils Relative: 0.3 %
Eosinophils Absolute: 43 cells/uL (ref 15–500)
Eosinophils Relative: 0.7 %
HCT: 40.7 % (ref 35.0–45.0)
Hemoglobin: 13.7 g/dL (ref 11.7–15.5)
Lymphs Abs: 1848 cells/uL (ref 850–3900)
MCH: 29.3 pg (ref 27.0–33.0)
MCHC: 33.7 g/dL (ref 32.0–36.0)
MCV: 87 fL (ref 80.0–100.0)
MPV: 10.3 fL (ref 7.5–12.5)
Monocytes Relative: 4.9 %
Neutro Abs: 3892 cells/uL (ref 1500–7800)
Neutrophils Relative %: 63.8 %
Platelets: 356 10*3/uL (ref 140–400)
RBC: 4.68 10*6/uL (ref 3.80–5.10)
RDW: 12.6 % (ref 11.0–15.0)
Total Lymphocyte: 30.3 %
WBC: 6.1 10*3/uL (ref 3.8–10.8)

## 2022-01-01 LAB — URINALYSIS, ROUTINE W REFLEX MICROSCOPIC
Bilirubin Urine: NEGATIVE
Glucose, UA: NEGATIVE
Hyaline Cast: NONE SEEN /LPF
Leukocytes,Ua: NEGATIVE
Nitrite: NEGATIVE
Specific Gravity, Urine: 1.024 (ref 1.001–1.035)
pH: 6 (ref 5.0–8.0)

## 2022-01-01 LAB — MICROALBUMIN / CREATININE URINE RATIO
Creatinine, Urine: 204 mg/dL (ref 20–275)
Microalb Creat Ratio: 6 mcg/mg creat (ref <30)
Microalb, Ur: 1.3 mg/dL

## 2022-01-01 LAB — COMPLETE METABOLIC PANEL WITH GFR
AG Ratio: 1.8 (calc) (ref 1.0–2.5)
ALT: 13 U/L (ref 5–32)
AST: 15 U/L (ref 12–32)
Albumin: 4.7 g/dL (ref 3.6–5.1)
Alkaline phosphatase (APISO): 74 U/L (ref 36–128)
BUN: 9 mg/dL (ref 7–20)
CO2: 26 mmol/L (ref 20–32)
Calcium: 10 mg/dL (ref 8.9–10.4)
Chloride: 104 mmol/L (ref 98–110)
Creat: 0.59 mg/dL (ref 0.50–0.96)
Globulin: 2.6 g/dL (calc) (ref 2.0–3.8)
Glucose, Bld: 79 mg/dL (ref 65–99)
Potassium: 4 mmol/L (ref 3.8–5.1)
Sodium: 139 mmol/L (ref 135–146)
Total Bilirubin: 0.6 mg/dL (ref 0.2–1.1)
Total Protein: 7.3 g/dL (ref 6.3–8.2)
eGFR: 133 mL/min/{1.73_m2} (ref >=60)

## 2022-01-01 LAB — MAGNESIUM: Magnesium: 2.2 mg/dL (ref 1.5–2.5)

## 2022-01-01 LAB — TSH: TSH: 1 mIU/L

## 2022-01-01 LAB — LIPID PANEL
Cholesterol: 206 mg/dL — ABNORMAL HIGH (ref <170)
HDL: 85 mg/dL (ref >45)
LDL Cholesterol (Calc): 105 mg/dL (calc) (ref <110)
Non-HDL Cholesterol (Calc): 121 mg/dL (calc) — ABNORMAL HIGH (ref <120)
Total CHOL/HDL Ratio: 2.4 (calc) (ref <5.0)
Triglycerides: 72 mg/dL (ref <90)

## 2022-01-01 LAB — VITAMIN D 25 HYDROXY (VIT D DEFICIENCY, FRACTURES): Vit D, 25-Hydroxy: 34 ng/mL (ref 30–100)

## 2022-01-01 LAB — HEMOGLOBIN A1C
Hgb A1c MFr Bld: 5.1 % of total Hgb (ref <5.7)
Mean Plasma Glucose: 100 mg/dL
eAG (mmol/L): 5.5 mmol/L

## 2022-01-11 DIAGNOSIS — Z20822 Contact with and (suspected) exposure to covid-19: Secondary | ICD-10-CM | POA: Diagnosis not present

## 2022-01-15 ENCOUNTER — Other Ambulatory Visit: Payer: Self-pay | Admitting: Adult Health

## 2022-01-15 DIAGNOSIS — F909 Attention-deficit hyperactivity disorder, unspecified type: Secondary | ICD-10-CM

## 2022-01-16 MED ORDER — METHYLPHENIDATE HCL ER (OSM) 36 MG PO TBCR: 36.0000 mg | EXTENDED_RELEASE_TABLET | Freq: Every day | ORAL | 0 refills | Status: DC | PRN

## 2022-01-26 ENCOUNTER — Other Ambulatory Visit: Payer: Self-pay

## 2022-01-26 DIAGNOSIS — R61 Generalized hyperhidrosis: Secondary | ICD-10-CM

## 2022-01-26 DIAGNOSIS — Z01419 Encounter for gynecological examination (general) (routine) without abnormal findings: Secondary | ICD-10-CM | POA: Diagnosis not present

## 2022-01-26 DIAGNOSIS — Z118 Encounter for screening for other infectious and parasitic diseases: Secondary | ICD-10-CM | POA: Diagnosis not present

## 2022-01-26 DIAGNOSIS — Z6825 Body mass index (BMI) 25.0-25.9, adult: Secondary | ICD-10-CM | POA: Diagnosis not present

## 2022-01-26 MED ORDER — GLYCOPYRROLATE 1 MG PO TABS: 1.0000 mg | ORAL_TABLET | Freq: Two times a day (BID) | ORAL | 0 refills | Status: DC | PRN

## 2022-02-09 ENCOUNTER — Encounter: Payer: Self-pay | Admitting: Nurse Practitioner

## 2022-02-10 ENCOUNTER — Other Ambulatory Visit: Payer: Self-pay | Admitting: Nurse Practitioner

## 2022-02-10 DIAGNOSIS — F32A Depression, unspecified: Secondary | ICD-10-CM

## 2022-02-10 MED ORDER — FLUOXETINE HCL 20 MG PO CAPS: 20.0000 mg | ORAL_CAPSULE | Freq: Every day | ORAL | 1 refills | Status: DC

## 2022-02-19 ENCOUNTER — Other Ambulatory Visit: Payer: Self-pay | Admitting: Nurse Practitioner

## 2022-02-19 DIAGNOSIS — F909 Attention-deficit hyperactivity disorder, unspecified type: Secondary | ICD-10-CM

## 2022-02-19 MED ORDER — METHYLPHENIDATE HCL ER (OSM) 36 MG PO TBCR: 36.0000 mg | EXTENDED_RELEASE_TABLET | Freq: Every day | ORAL | 0 refills | Status: DC | PRN

## 2022-02-22 ENCOUNTER — Other Ambulatory Visit: Payer: Self-pay | Admitting: Adult Health

## 2022-02-22 DIAGNOSIS — F909 Attention-deficit hyperactivity disorder, unspecified type: Secondary | ICD-10-CM

## 2022-03-03 ENCOUNTER — Other Ambulatory Visit: Payer: Self-pay | Admitting: Nurse Practitioner

## 2022-03-03 DIAGNOSIS — R61 Generalized hyperhidrosis: Secondary | ICD-10-CM

## 2022-03-04 MED ORDER — GLYCOPYRROLATE 1 MG PO TABS: 1.0000 mg | ORAL_TABLET | Freq: Two times a day (BID) | ORAL | 0 refills | Status: DC | PRN

## 2022-03-20 ENCOUNTER — Other Ambulatory Visit (HOSPITAL_BASED_OUTPATIENT_CLINIC_OR_DEPARTMENT_OTHER): Payer: Self-pay | Admitting: Nurse Practitioner

## 2022-03-20 DIAGNOSIS — Z20818 Contact with and (suspected) exposure to other bacterial communicable diseases: Secondary | ICD-10-CM

## 2022-03-20 MED ORDER — AMOXICILLIN-POT CLAVULANATE 400-57 MG/5ML PO SUSR: 875.0000 mg | Freq: Two times a day (BID) | ORAL | 0 refills | Status: DC

## 2022-03-23 ENCOUNTER — Other Ambulatory Visit (HOSPITAL_BASED_OUTPATIENT_CLINIC_OR_DEPARTMENT_OTHER): Payer: Self-pay | Admitting: Nurse Practitioner

## 2022-03-23 DIAGNOSIS — R112 Nausea with vomiting, unspecified: Secondary | ICD-10-CM

## 2022-03-23 DIAGNOSIS — R110 Nausea: Secondary | ICD-10-CM

## 2022-03-23 MED ORDER — ONDANSETRON 8 MG PO TBDP: 8.0000 mg | ORAL_TABLET | Freq: Three times a day (TID) | ORAL | 3 refills | Status: DC | PRN

## 2022-03-23 MED ORDER — PROMETHAZINE HCL 25 MG PO TABS: 25.0000 mg | ORAL_TABLET | Freq: Three times a day (TID) | ORAL | 2 refills | Status: DC | PRN

## 2022-03-28 ENCOUNTER — Other Ambulatory Visit: Payer: Self-pay | Admitting: Adult Health

## 2022-03-28 DIAGNOSIS — F909 Attention-deficit hyperactivity disorder, unspecified type: Secondary | ICD-10-CM

## 2022-03-29 MED ORDER — METHYLPHENIDATE HCL ER (OSM) 36 MG PO TBCR: 36.0000 mg | EXTENDED_RELEASE_TABLET | Freq: Every day | ORAL | 0 refills | Status: DC | PRN

## 2022-03-30 ENCOUNTER — Other Ambulatory Visit: Payer: Self-pay | Admitting: Nurse Practitioner

## 2022-03-30 DIAGNOSIS — R61 Generalized hyperhidrosis: Secondary | ICD-10-CM

## 2022-04-22 ENCOUNTER — Other Ambulatory Visit: Payer: Self-pay | Admitting: Nurse Practitioner

## 2022-04-22 DIAGNOSIS — R61 Generalized hyperhidrosis: Secondary | ICD-10-CM

## 2022-04-28 ENCOUNTER — Other Ambulatory Visit: Payer: Self-pay | Admitting: Nurse Practitioner

## 2022-04-28 DIAGNOSIS — F909 Attention-deficit hyperactivity disorder, unspecified type: Secondary | ICD-10-CM

## 2022-04-28 MED ORDER — METHYLPHENIDATE HCL ER (OSM) 36 MG PO TBCR: 36.0000 mg | EXTENDED_RELEASE_TABLET | Freq: Every day | ORAL | 0 refills | Status: DC | PRN

## 2022-05-03 ENCOUNTER — Other Ambulatory Visit (HOSPITAL_BASED_OUTPATIENT_CLINIC_OR_DEPARTMENT_OTHER): Payer: Self-pay

## 2022-05-03 ENCOUNTER — Other Ambulatory Visit: Payer: Self-pay | Admitting: Nurse Practitioner

## 2022-05-03 ENCOUNTER — Encounter: Payer: Self-pay | Admitting: Nurse Practitioner

## 2022-05-03 DIAGNOSIS — F909 Attention-deficit hyperactivity disorder, unspecified type: Secondary | ICD-10-CM

## 2022-05-03 MED ORDER — METHYLPHENIDATE HCL ER (OSM) 36 MG PO TBCR
36.0000 mg | EXTENDED_RELEASE_TABLET | Freq: Every day | ORAL | 0 refills | Status: DC | PRN
  Filled 2022-05-03: qty 30, 30d supply, fill #0

## 2022-05-22 ENCOUNTER — Other Ambulatory Visit: Payer: Self-pay | Admitting: Nurse Practitioner

## 2022-05-22 DIAGNOSIS — R61 Generalized hyperhidrosis: Secondary | ICD-10-CM

## 2022-06-03 ENCOUNTER — Encounter: Payer: Self-pay | Admitting: Nurse Practitioner

## 2022-06-03 ENCOUNTER — Ambulatory Visit (INDEPENDENT_AMBULATORY_CARE_PROVIDER_SITE_OTHER): Payer: BC Managed Care – PPO | Admitting: Nurse Practitioner

## 2022-06-03 VITALS — BP 112/60 | HR 87 | Temp 97.9°F | Wt 163.0 lb

## 2022-06-03 DIAGNOSIS — Z79899 Other long term (current) drug therapy: Secondary | ICD-10-CM

## 2022-06-03 DIAGNOSIS — E663 Overweight: Secondary | ICD-10-CM

## 2022-06-03 DIAGNOSIS — E559 Vitamin D deficiency, unspecified: Secondary | ICD-10-CM

## 2022-06-03 DIAGNOSIS — F909 Attention-deficit hyperactivity disorder, unspecified type: Secondary | ICD-10-CM

## 2022-06-03 DIAGNOSIS — R61 Generalized hyperhidrosis: Secondary | ICD-10-CM

## 2022-06-03 DIAGNOSIS — Z6825 Body mass index (BMI) 25.0-25.9, adult: Secondary | ICD-10-CM

## 2022-06-03 DIAGNOSIS — F321 Major depressive disorder, single episode, moderate: Secondary | ICD-10-CM

## 2022-06-03 DIAGNOSIS — E782 Mixed hyperlipidemia: Secondary | ICD-10-CM

## 2022-06-03 HISTORY — DX: Body mass index (BMI) 25.0-25.9, adult: Z68.25

## 2022-06-03 MED ORDER — METHYLPHENIDATE HCL ER (OSM) 36 MG PO TBCR
36.0000 mg | EXTENDED_RELEASE_TABLET | Freq: Every day | ORAL | 0 refills | Status: DC | PRN
Start: 2022-06-03 — End: 2022-07-23

## 2022-06-03 NOTE — Progress Notes (Signed)
Assessment and Plan:  Lynzie was seen today for follow-up.  Diagnoses and all orders for this visit:  Attention deficit hyperactivity disorder (ADHD), unspecified ADHD type Continue medications: concerta 36 mg daily- refilled today, PDMP appropriate Helps with focus, no AE's. The patient was counseled on the addictive nature of the medication and was encouraged to take drug holidays when not needed.   Depression, unspecified depression type Continue Prozac 20 mg Lifestyle discussed: diet/exerise, sleep hygiene, stress management, hydration   Hyperhidrosis Robinul 1mg  BID Given behavior modification strategies  Hyperlipidemia Last check was not at goal Continue diet and exercise - Lipid panel, CBC, CMP  Overweight Long discussion about weight loss, diet, and exercise Recommended diet heavy in fruits and veggies and low in animal meats, cheeses, and dairy products, appropriate calorie intake Patient will work on increasing activity and limiting simple carbs Follow up at next visit  Vitamin D Deficiency - Vit D  Medication Management - CBC, CMP, Vit D, Lipid  Further disposition pending results of labs. Discussed med's effects and SE's.   Over 15 minutes of exam, counseling, chart review, and critical decision making was performed.   Future Appointments  Date Time Provider Department Center  01/03/2023  3:00 PM 01/05/2023, NP [facility] None    ------------------------------------------------------------------------------------------------------------------   HPI BP 112/60   Pulse 87   Temp 97.9 F (36.6 C)   Wt 163 lb (73.9 kg)   LMP 05/13/2022   SpO2 97%   BMI 26.92 kg/m    20 y.o.female presents for follow up of depression and ADD, hyperlipidemia.    She has been on Prozac for depression and is doing well with 20 mg daily dose.  Also previously on vyvanse 70 mg, was swtiched to concerta due to insurance coverage issue, now up to 36 mg daily,  reports works very well.    She is on Robinul for excessive sweating and is working well.   BMI is Body mass index is 26.92 kg/m., she has been working on diet and exercise, She has been working more and with has stress with school but has not been exercising as much Wt Readings from Last 3 Encounters:  06/03/22 163 lb (73.9 kg) (89 %, Z= 1.21)*  12/31/21 156 lb (70.8 kg) (85 %, Z= 1.05)*  05/12/21 143 lb (64.9 kg) (76 %, Z= 0.70)*   * Growth percentiles are based on CDC (Girls, 2-20 Years) data.   Patient is on Vitamin D supplement and B 12 supplement Lab Results  Component Value Date   VD25OH 34 12/31/2021     Lab Results  Component Value Date   VITAMINB12 1,813 (H) 12/25/2019     She is not medication. She does not eat red meat, dairy, eggs.  Lab Results  Component Value Date   CHOL 206 (H) 12/31/2021   HDL 85 12/31/2021   LDLCALC 105 12/31/2021   TRIG 72 12/31/2021   CHOLHDL 2.4 12/31/2021      Allergies  Allergen Reactions   Wound Dressing Adhesive Rash   Sulfa Antibiotics     Current Outpatient Medications on File Prior to Visit  Medication Sig   BLISOVI 24 FE 1-20 MG-MCG(24) tablet    Cholecalciferol (VITAMIN D3) 125 MCG (5000 UT) CAPS Takes 1 capsule Daily   Cyanocobalamin (VITAMIN B-12) 1000 MCG SUBL Takes 1 tablet SL Daily   fexofenadine (ALLEGRA) 180 MG tablet Take 180 mg by mouth daily.   FLUoxetine (PROZAC) 20 MG capsule Take 1 capsule (20 mg total)  by mouth daily.   glycopyrrolate (ROBINUL) 1 MG tablet TAKE 1 TABLET BY MOUTH TWICE A DAY AS NEEDED   methylphenidate (CONCERTA) 36 MG PO CR tablet Take 1 tablet (36 mg total) by mouth daily as needed. For mood and focus. Try to limit to <5 days/week avoid tolerance.   ondansetron (ZOFRAN-ODT) 8 MG disintegrating tablet Take 1 tablet (8 mg total) by mouth every 8 (eight) hours as needed for nausea.   promethazine (PHENERGAN) 25 MG tablet Take 1 tablet (25 mg total) by mouth every 8 (eight) hours as needed  for refractory nausea / vomiting.   amoxicillin-clavulanate (AUGMENTIN) 400-57 MG/5ML suspension Take 10.9 mLs (872 mg total) by mouth 2 (two) times daily. (Patient not taking: Reported on 06/03/2022)   No current facility-administered medications on file prior to visit.    ROS: all negative except above.   Physical Exam:  BP 112/60   Pulse 87   Temp 97.9 F (36.6 C)   Wt 163 lb (73.9 kg)   LMP 05/13/2022   SpO2 97%   BMI 26.92 kg/m   General Appearance: Well nourished, in no apparent distress. Eyes: PERRL, conjunctiva no swelling or erythema ENT/Mouth: Mask in place; Hearing normal.  Neck: Supple, thyroid  Respiratory: Respiratory effort normal Cardio:  Appears well perfused Musculoskeletal:  normal gait.  Skin: Warm, dry without rashes, lesions, ecchymosis.  Neuro: Normal muscle tone Psych: Awake and oriented X 3, pleasant, normal affect, Insight and Judgment appropriate.     Raynelle Dick, NP 3:43 PM [facility]

## 2022-06-04 LAB — VITAMIN D 25 HYDROXY (VIT D DEFICIENCY, FRACTURES): Vit D, 25-Hydroxy: 34 ng/mL (ref 30–100)

## 2022-06-04 LAB — COMPLETE METABOLIC PANEL WITH GFR
AG Ratio: 1.5 (calc) (ref 1.0–2.5)
ALT: 8 U/L (ref 5–32)
AST: 15 U/L (ref 12–32)
Albumin: 4.3 g/dL (ref 3.6–5.1)
Alkaline phosphatase (APISO): 64 U/L (ref 36–128)
BUN: 13 mg/dL (ref 7–20)
CO2: 27 mmol/L (ref 20–32)
Calcium: 9.7 mg/dL (ref 8.9–10.4)
Chloride: 105 mmol/L (ref 98–110)
Creat: 0.72 mg/dL (ref 0.50–0.96)
Globulin: 2.8 g/dL (calc) (ref 2.0–3.8)
Glucose, Bld: 88 mg/dL (ref 65–99)
Potassium: 4.5 mmol/L (ref 3.8–5.1)
Sodium: 140 mmol/L (ref 135–146)
Total Bilirubin: 0.4 mg/dL (ref 0.2–1.1)
Total Protein: 7.1 g/dL (ref 6.3–8.2)
eGFR: 123 mL/min/{1.73_m2} (ref >=60)

## 2022-06-04 LAB — CBC WITH DIFFERENTIAL/PLATELET
Absolute Monocytes: 410 cells/uL (ref 200–950)
Basophils Absolute: 32 cells/uL (ref 0–200)
Basophils Relative: 0.5 %
Eosinophils Absolute: 69 cells/uL (ref 15–500)
Eosinophils Relative: 1.1 %
HCT: 36.3 % (ref 35.0–45.0)
Hemoglobin: 11.9 g/dL (ref 11.7–15.5)
Lymphs Abs: 2558 cells/uL (ref 850–3900)
MCH: 27.5 pg (ref 27.0–33.0)
MCHC: 32.8 g/dL (ref 32.0–36.0)
MCV: 84 fL (ref 80.0–100.0)
MPV: 10.3 fL (ref 7.5–12.5)
Monocytes Relative: 6.5 %
Neutro Abs: 3232 cells/uL (ref 1500–7800)
Neutrophils Relative %: 51.3 %
Platelets: 415 10*3/uL — ABNORMAL HIGH (ref 140–400)
RBC: 4.32 10*6/uL (ref 3.80–5.10)
RDW: 12.3 % (ref 11.0–15.0)
Total Lymphocyte: 40.6 %
WBC: 6.3 10*3/uL (ref 3.8–10.8)

## 2022-06-04 LAB — LIPID PANEL
Cholesterol: 205 mg/dL — ABNORMAL HIGH (ref <170)
HDL: 78 mg/dL (ref >45)
LDL Cholesterol (Calc): 109 mg/dL (calc) (ref <110)
Non-HDL Cholesterol (Calc): 127 mg/dL (calc) — ABNORMAL HIGH (ref <120)
Total CHOL/HDL Ratio: 2.6 (calc) (ref <5.0)
Triglycerides: 86 mg/dL (ref <90)

## 2022-06-28 ENCOUNTER — Other Ambulatory Visit: Payer: Self-pay | Admitting: Nurse Practitioner

## 2022-06-28 DIAGNOSIS — R61 Generalized hyperhidrosis: Secondary | ICD-10-CM

## 2022-07-21 ENCOUNTER — Other Ambulatory Visit: Payer: Self-pay | Admitting: Nurse Practitioner

## 2022-07-21 DIAGNOSIS — F909 Attention-deficit hyperactivity disorder, unspecified type: Secondary | ICD-10-CM

## 2022-07-23 ENCOUNTER — Other Ambulatory Visit: Payer: Self-pay | Admitting: Internal Medicine

## 2022-07-23 DIAGNOSIS — F909 Attention-deficit hyperactivity disorder, unspecified type: Secondary | ICD-10-CM

## 2022-07-23 MED ORDER — METHYLPHENIDATE HCL ER (OSM) 36 MG PO TBCR: EXTENDED_RELEASE_TABLET | ORAL | 0 refills | Status: DC

## 2022-07-25 ENCOUNTER — Other Ambulatory Visit: Payer: Self-pay | Admitting: Nurse Practitioner

## 2022-07-25 DIAGNOSIS — R61 Generalized hyperhidrosis: Secondary | ICD-10-CM

## 2022-08-14 ENCOUNTER — Other Ambulatory Visit (HOSPITAL_BASED_OUTPATIENT_CLINIC_OR_DEPARTMENT_OTHER): Payer: Self-pay | Admitting: Nurse Practitioner

## 2022-08-14 DIAGNOSIS — N3090 Cystitis, unspecified without hematuria: Secondary | ICD-10-CM

## 2022-08-14 MED ORDER — FLUCONAZOLE 150 MG PO TABS: ORAL_TABLET | ORAL | 2 refills | Status: DC

## 2022-08-14 MED ORDER — NITROFURANTOIN MONOHYD MACRO 100 MG PO CAPS: 100.0000 mg | ORAL_CAPSULE | Freq: Two times a day (BID) | ORAL | 0 refills | Status: DC

## 2022-08-18 ENCOUNTER — Other Ambulatory Visit: Payer: Self-pay | Admitting: Internal Medicine

## 2022-08-18 DIAGNOSIS — R61 Generalized hyperhidrosis: Secondary | ICD-10-CM

## 2022-08-23 ENCOUNTER — Other Ambulatory Visit (HOSPITAL_BASED_OUTPATIENT_CLINIC_OR_DEPARTMENT_OTHER): Payer: Self-pay | Admitting: Nurse Practitioner

## 2022-08-23 MED ORDER — LEVONORGESTREL 1.5 MG PO TABS: 1.5000 mg | ORAL_TABLET | Freq: Once | ORAL | 0 refills | Status: AC

## 2022-08-25 ENCOUNTER — Other Ambulatory Visit: Payer: Self-pay | Admitting: Internal Medicine

## 2022-08-25 DIAGNOSIS — F909 Attention-deficit hyperactivity disorder, unspecified type: Secondary | ICD-10-CM

## 2022-08-25 MED ORDER — METHYLPHENIDATE HCL ER (OSM) 36 MG PO TBCR: EXTENDED_RELEASE_TABLET | ORAL | 0 refills | Status: DC

## 2022-08-28 ENCOUNTER — Encounter: Payer: Self-pay | Admitting: Nurse Practitioner

## 2022-09-01 NOTE — Progress Notes (Unsigned)
Assessment and Plan:  There are no diagnoses linked to this encounter.    Further disposition pending results of labs. Discussed med's effects and SE's.   Over 30 minutes of exam, counseling, chart review, and critical decision making was performed.   Future Appointments  Date Time Provider [place]  09/02/2022  3:45 PM Alycia Rossetti, NP [facility] None  01/03/2023  3:00 PM Alycia Rossetti, NP [facility] None    ------------------------------------------------------------------------------------------------------------------   HPI There were no vitals taken for this visit. 21 y.o.female presents for  No past medical history on file.   Allergies  Allergen Reactions   Wound Dressing Adhesive Rash   Sulfa Antibiotics     Current Outpatient Medications on File Prior to Visit  Medication Sig   BLISOVI 24 FE 1-20 MG-MCG(24) tablet    Cholecalciferol (VITAMIN D3) 125 MCG (5000 UT) CAPS Takes 1 capsule Daily   Cyanocobalamin (VITAMIN B-12) 1000 MCG SUBL Takes 1 tablet SL Daily   fexofenadine (ALLEGRA) 180 MG tablet Take 180 mg by mouth daily.   fluconazole (DIFLUCAN) 150 MG tablet Take one tablet by mouth at the first sign of symptoms of yeast. If no resolution, repeat dose in 72 hours.   FLUoxetine (PROZAC) 20 MG capsule Take 1 capsule (20 mg total) by mouth daily.   glycopyrrolate (ROBINUL) 1 MG tablet TAKE 1 TABLET BY MOUTH TWICE A DAY AS NEEDED   methylphenidate (CONCERTA) 36 MG PO CR tablet Take  1 tablet  Daily for ADD - Focus & Concentration                                        /               take                         by                 mouth   nitrofurantoin, macrocrystal-monohydrate, (MACROBID) 100 MG capsule Take 1 capsule (100 mg total) by mouth 2 (two) times daily.   ondansetron (ZOFRAN-ODT) 8 MG disintegrating tablet Take 1 tablet (8 mg total) by mouth every 8 (eight) hours as needed for nausea.   promethazine (PHENERGAN) 25 MG tablet Take 1 tablet  (25 mg total) by mouth every 8 (eight) hours as needed for refractory nausea / vomiting.   No current facility-administered medications on file prior to visit.    ROS: all negative except above.   Physical Exam:  There were no vitals taken for this visit.  General Appearance: Well nourished, in no apparent distress. Eyes: PERRLA, EOMs, conjunctiva no swelling or erythema Sinuses: No Frontal/maxillary tenderness ENT/Mouth: Ext aud canals clear, TMs without erythema, bulging. No erythema, swelling, or exudate on post pharynx.  Tonsils not swollen or erythematous. Hearing normal.  Neck: Supple, thyroid normal.  Respiratory: Respiratory effort normal, BS equal bilaterally without rales, rhonchi, wheezing or stridor.  Cardio: RRR with no MRGs. Brisk peripheral pulses without edema.  Abdomen: Soft, + BS.  Non tender, no guarding, rebound, hernias, masses. Lymphatics: Non tender without lymphadenopathy.  Musculoskeletal: Full ROM, 5/5 strength, normal gait.  Skin: Warm, dry without rashes, lesions, ecchymosis.  Neuro: Cranial nerves intact. Normal muscle tone, no cerebellar symptoms. Sensation intact.  Psych: Awake and oriented X 3, normal affect, Insight and Judgment appropriate.  Raynelle Dick, NP 12:37 PM [facility]

## 2022-09-02 ENCOUNTER — Ambulatory Visit (HOSPITAL_COMMUNITY)
Admission: RE | Admit: 2022-09-02 | Discharge: 2022-09-02 | Disposition: A | Discharge status: Home / Self Care | Payer: BC Managed Care – PPO | Source: Ambulatory Visit | Attending: Nurse Practitioner | Admitting: Nurse Practitioner

## 2022-09-02 ENCOUNTER — Encounter: Payer: Self-pay | Admitting: Nurse Practitioner

## 2022-09-02 ENCOUNTER — Other Ambulatory Visit (HOSPITAL_BASED_OUTPATIENT_CLINIC_OR_DEPARTMENT_OTHER): Payer: Self-pay | Admitting: Nurse Practitioner

## 2022-09-02 ENCOUNTER — Ambulatory Visit (INDEPENDENT_AMBULATORY_CARE_PROVIDER_SITE_OTHER): Payer: BC Managed Care – PPO | Admitting: Nurse Practitioner

## 2022-09-02 VITALS — BP 114/80 | HR 109 | Temp 97.7°F | Ht 65.25 in | Wt 151.2 lb

## 2022-09-02 DIAGNOSIS — Z113 Encounter for screening for infections with a predominantly sexual mode of transmission: Secondary | ICD-10-CM

## 2022-09-02 DIAGNOSIS — R109 Unspecified abdominal pain: Secondary | ICD-10-CM

## 2022-09-02 DIAGNOSIS — R310 Gross hematuria: Secondary | ICD-10-CM | POA: Diagnosis not present

## 2022-09-02 DIAGNOSIS — N3090 Cystitis, unspecified without hematuria: Secondary | ICD-10-CM

## 2022-09-02 DIAGNOSIS — N3289 Other specified disorders of bladder: Secondary | ICD-10-CM | POA: Diagnosis not present

## 2022-09-02 MED ORDER — CIPROFLOXACIN HCL 500 MG PO TABS: 500.0000 mg | ORAL_TABLET | Freq: Two times a day (BID) | ORAL | 0 refills | Status: AC

## 2022-09-02 NOTE — Patient Instructions (Signed)
Start Cipro Twice a day CT  Kidney Stones  Kidney stones are solid, rock-like deposits that form inside of the kidneys. The kidneys are a pair of organs that make urine. A kidney stone may form in a kidney and move into other parts of the urinary tract, including the tubes that connect the kidneys to the bladder (ureters), the bladder, and the tube that carries urine out of the body (urethra). As the stone moves through these areas, it can cause intense pain and block the flow of urine. Kidney stones are created when high levels of certain minerals are found in the urine. The stones are usually passed out of the body through urination, but in some cases, medical treatment may be needed to remove them. What are the causes? Kidney stones may be caused by: A condition in which certain glands produce too much parathyroid hormone (primary hyperparathyroidism), which causes too much calcium buildup in the blood. A buildup of uric acid crystals in the bladder (hyperuricosuria). Uric acid is a chemical that the body produces when you eat certain foods. It usually leaves the body in the urine. Narrowing (stricture) of one or both of the ureters. A kidney blockage that is present at birth (congenital obstruction). Past surgery on the kidney or the ureters. What increases the risk? The following factors may make you more likely to develop this condition: Having had a kidney stone in the past. Having a family history of kidney stones. Not drinking enough water. Eating a diet that is high in protein, salt (sodium), or sugar. Being overweight or obese. What are the signs or symptoms? Symptoms of a kidney stone may include: Pain in the side of the abdomen, right below the ribs (flank pain). Pain usually spreads (radiates) to the groin. Needing to urinate often or urgently. Painful urination. Blood in the urine (hematuria). Nausea. Vomiting. Fever and chills. How is this diagnosed? This condition may  be diagnosed based on: Your symptoms and medical history. A physical exam. Blood tests. Urine tests. These may be done before and after the stone passes out of your body through urination. Imaging tests, such as a CT scan, abdominal X-ray, or ultrasound. A procedure to examine the inside of the bladder (cystoscopy). How is this treated? Treatment for kidney stones depends on the size, location, and makeup of the stones. Kidney stones will often pass out of the body through urination. You may need to: Increase your fluid intake to help pass the stone. In some cases, you may be given fluids through an IV and may need to be monitored in the hospital. Take medicine for pain. Make changes in your diet to help prevent kidney stones from coming back. Sometimes, procedures are needed to remove a kidney stone. This may involve: A procedure to break up kidney stones using: A focused beam of light (laser therapy). Shock waves (extracorporeal shock wave lithotripsy). Surgery to remove kidney stones. This may be needed if you have severe pain or have stones that block your urinary tract. Follow these instructions at home: Medicines Take over-the-counter and prescription medicines only as told by your health care provider. Ask your health care provider if the medicine prescribed to you requires you to avoid driving or using heavy machinery. Eating and drinking Drink enough fluid to keep your urine pale yellow. You may be instructed to drink at least 8-10 glasses of water each day. This will help you pass the kidney stone. If directed, change your diet. This may include: Limiting how  much sodium you eat. Eating more fruits and vegetables. Limiting how much animal protein you eat. Animal proteins include red meat, poultry, fish, and eggs. Eating a normal amount of calcium (1,000-1,300 mg per day). Follow instructions from your health care provider about eating or drinking restrictions. General  instructions Collect urine samples as told by your health care provider. You may need to collect a urine sample: 24 hours after you pass the stone. 8-12 weeks after you pass the kidney stone, and every 6-12 months after that. Strain your urine every time you urinate, for as long as directed. Use the strainer that your health care provider recommends. Do not throw out the kidney stone after passing it. Keep the stone so it can be tested by your health care provider. Testing the makeup of your kidney stone may help prevent you from getting kidney stones in the future. Keep all follow-up visits. You may need follow-up X-rays or ultrasounds to make sure that your stone has passed. How is this prevented? To prevent another kidney stone: Drink enough fluid to keep your urine pale yellow. This is the best way to prevent kidney stones. Eat a healthy diet. Follow recommendations from your health care provider about foods to avoid. Recommendations vary depending on the type of kidney stone that you have. You may be instructed to eat a low-protein diet. Maintain a healthy weight. Where to find more information [place] ([facility]): www.kidney.[facility]): www.urologyhealth.org Contact a health care provider if: You have pain that gets worse or does not get better with medicine. Get help right away if: You have a fever or chills. You develop severe pain. You develop new abdominal pain. You faint. You are unable to urinate. Summary Kidney stones are solid, rock-like deposits that form inside of the kidneys. Kidney stones can cause nausea, vomiting, blood in the urine, abdominal pain, and the urge to urinate often. Treatment for kidney stones depends on the size, location, and makeup of the stones. Kidney stones will often pass out of the body through urination. Kidney stones can be prevented by drinking enough fluids, eating a healthy diet, and maintaining a healthy  weight. This information is not intended to replace advice given to you by your health care provider. Make sure you discuss any questions you have with your health care provider. Document Revised: 03/10/2022 Document Reviewed: 03/10/2022 Elsevier Patient Education  [place].

## 2022-09-05 LAB — RPR: RPR Ser Ql: NONREACTIVE

## 2022-09-05 LAB — URINALYSIS, ROUTINE W REFLEX MICROSCOPIC
Bacteria, UA: NONE SEEN /HPF
Bilirubin Urine: NEGATIVE
Glucose, UA: NEGATIVE
Nitrite: NEGATIVE
Specific Gravity, Urine: 1.019 (ref 1.001–1.035)
WBC, UA: >=60 /HPF — AB (ref 0–5)
pH: 6 (ref 5.0–8.0)

## 2022-09-05 LAB — HIV ANTIBODY (ROUTINE TESTING W REFLEX): HIV 1&2 Ab, 4th Generation: NONREACTIVE

## 2022-09-05 LAB — C. TRACHOMATIS/N. GONORRHOEAE RNA
C. trachomatis RNA, TMA: NOT DETECTED
N. gonorrhoeae RNA, TMA: NOT DETECTED

## 2022-09-05 LAB — MICROSCOPIC MESSAGE

## 2022-09-05 LAB — URINE CULTURE
MICRO NUMBER:: 13951313
SPECIMEN QUALITY:: ADEQUATE

## 2022-09-18 ENCOUNTER — Other Ambulatory Visit: Payer: Self-pay | Admitting: Nurse Practitioner

## 2022-09-18 DIAGNOSIS — R61 Generalized hyperhidrosis: Secondary | ICD-10-CM

## 2022-09-27 ENCOUNTER — Other Ambulatory Visit: Payer: Self-pay | Admitting: Nurse Practitioner

## 2022-09-27 DIAGNOSIS — F909 Attention-deficit hyperactivity disorder, unspecified type: Secondary | ICD-10-CM

## 2022-09-27 MED ORDER — METHYLPHENIDATE HCL ER (OSM) 36 MG PO TBCR: EXTENDED_RELEASE_TABLET | ORAL | 0 refills | Status: DC

## 2022-11-05 ENCOUNTER — Other Ambulatory Visit (HOSPITAL_BASED_OUTPATIENT_CLINIC_OR_DEPARTMENT_OTHER): Payer: Self-pay

## 2022-11-06 ENCOUNTER — Other Ambulatory Visit: Payer: Self-pay | Admitting: Nurse Practitioner

## 2022-11-06 DIAGNOSIS — F909 Attention-deficit hyperactivity disorder, unspecified type: Secondary | ICD-10-CM

## 2022-11-08 MED ORDER — METHYLPHENIDATE HCL ER (OSM) 36 MG PO TBCR: EXTENDED_RELEASE_TABLET | ORAL | 0 refills | Status: DC

## 2022-12-15 ENCOUNTER — Encounter: Payer: BC Managed Care – PPO | Admitting: Nurse Practitioner

## 2022-12-30 NOTE — Progress Notes (Signed)
COMPLETE PHSYICAL   Assessment and Plan:  Encounter for general adult medical examination with abnormal findings Yearly  Attention deficit hyperactivity disorder (ADHD), unspecified ADHD type Currently on Concerta 36 mg and symptoms are well managed  Vitamin D deficiency Continue supplementation to maintain goal of 70-100 Taking Vitamin D 5,000IU daily -     VITAMIN D 25 Hydroxy (Vit-D Deficiency, Fractures)  Raynaud's disease without gangrene Stay warm, can use cream as needed Call if worsening symptoms or new symptoms.   Hyperhidrosis Robinul 1mg  BID- helping well Given behavior modification strategies  Medication management - Magnesium  Hyperlipidemia Continue diet and exercise -     Lipid panel - CBC - CMP  Screening for hematuria or proteinuria/ Foul odor to urine -     Urinalysis with microscopic and reflex culture  Screening for thyroid - TSH  Abnormal Glucose Continue diet and exercise -     Hemoglobin A1c  Overweight Long discussion about weight loss, diet, and exercise Recommended diet heavy in fruits and veggies and low in animal meats, cheeses, and dairy products, appropriate calorie intake Patient will work on increasing exercise and watching carbohydrates and saturated fats. Follow up at next visit   Screening for cervical cancer - Thin prep Pap taken and submitted  Discussed med's effects and SE's. Screening labs and tests as requested with regular follow-up as recommended. Over 40 minutes of face to face interview,  exam, counseling, chart review, and complex, high level critical decision making was performed this visit.   HPI  22 y.o. female  Presents for complete physical and for has Raynaud's disease without gangrene; Attention deficit hyperactivity disorder (ADHD); Vitamin D deficiency; Medication management; Depressed; Overweight with body mass index (BMI) of 25 to 25.9 in adult; Hyperlipidemia, mixed; and Hyperhidrosis on their problem  list..  Her blood pressure has been controlled at home, today their BP is BP: 104/64 BP Readings from Last 3 Encounters:  01/03/23 104/64  09/02/22 114/80  06/03/22 112/60  She does not workout. She denies chest pain, shortness of breath, dizziness.    She has been having issues with excessive sweating.  Palms are nearly always moist and this makes her Raynauds symptoms worse. She has never tried medication.  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 She is currently taking anatomy and physiology to get into the program.   She follows with GYN and is on BCP for irregular menses, she is sexually active with female partner.  Periods are controlled well OC's.  Reports use of external / internal stimulators. Discussed hygiene.  She gets hives for years, take allegra which helps. She has DX of raynauds, has not had ulcers or rash on her feet lately.   BMI is Body mass index is 25.23 kg/m., she is working on diet and exercise. She is not exercising as much,  Wt Readings from Last 3 Encounters:  01/03/23 152 lb 12.8 oz (69.3 kg)  09/02/22 151 lb 3.2 oz (68.6 kg)  06/03/22 163 lb (73.9 kg) (89 %, Z= 1.21)*   * Growth percentiles are based on CDC (Girls, 2-20 Years) data.     Current Medications:  Current Outpatient Medications on File Prior to Visit  Medication Sig Dispense Refill   BLISOVI 24 FE 1-20 MG-MCG(24) tablet   9   Cholecalciferol (VITAMIN D3) 125 MCG (5000 UT) CAPS Takes 1 capsule Daily 1 capsule 0   Cyanocobalamin (VITAMIN B-12) 1000 MCG SUBL Takes 1 tablet SL Daily 1 tablet 0   fexofenadine (ALLEGRA) 180  MG tablet Take 180 mg by mouth daily.     FLUoxetine (PROZAC) 20 MG capsule Take 1 capsule (20 mg total) by mouth daily. 90 capsule 1   glycopyrrolate (ROBINUL) 1 MG tablet TAKE 1 TABLET BY MOUTH TWICE A DAY AS NEEDED 180 tablet 1   methylphenidate (CONCERTA) 36 MG PO CR tablet Take  1 tablet  Daily for ADD - Focus & Concentration                                        /                take                         by                 mouth 30 tablet 0   ondansetron (ZOFRAN-ODT) 8 MG disintegrating tablet Take 1 tablet (8 mg total) by mouth every 8 (eight) hours as needed for nausea. 30 tablet 3   promethazine (PHENERGAN) 25 MG tablet Take 1 tablet (25 mg total) by mouth every 8 (eight) hours as needed for refractory nausea / vomiting. 30 tablet 2   No current facility-administered medications on file prior to visit.   Allergies:  Allergies  Allergen Reactions   Wound Dressing Adhesive Rash   Sulfa Antibiotics    Medical History:  She has Raynaud's disease without gangrene; Attention deficit hyperactivity disorder (ADHD); Vitamin D deficiency; Medication management; Depressed; Overweight with body mass index (BMI) of 25 to 25.9 in adult; Hyperlipidemia, mixed; and Hyperhidrosis on their problem list.   Health Maintenance:   Immunization History  Administered Date(s) Administered   HPV 9-valent 10/06/2020   Influenza-Unspecified 10/22/2021, 11/09/2021, 10/28/2022   Meningococcal Conjugate 07/31/2019   PFIZER(Purple Top)SARS-COV-2 Vaccination 03/17/2020, 04/09/2020   WILL GET IMMUNIZATION LIST  Tetanus:  Pneumovax: Prevnar 13:  Flu vaccine: 10/22/21 Zostavax: HPV: Due for 2nd & 3rd doses, [facility]. Provided written prescription  Patient Care Team: Unk Pinto, MD as PCP - General (Internal Medicine)  Surgical History:  She has no past surgical history on file. Family History:  Herfamily history includes Bipolar disorder in her sister; Cancer in her maternal grandmother; Heart disease in her sister; Rheumatologic disease in her paternal grandfather. Social History:  She reports that she has never smoked. She has never used smokeless tobacco. She reports that she does not drink alcohol and does not use drugs.  Review of Systems: Review of Systems  Constitutional: Negative.  Negative for chills and fever.  HENT: Negative.  Negative for  congestion, hearing loss, sinus pain, sore throat and tinnitus.   Eyes: Negative.  Negative for blurred vision and double vision.  Respiratory: Negative.  Negative for cough, hemoptysis, sputum production, shortness of breath and wheezing.   Cardiovascular: Negative.  Negative for chest pain, palpitations and leg swelling.       Raynauds of hands  Gastrointestinal: Negative.  Negative for abdominal pain, constipation, diarrhea, heartburn, nausea and vomiting.  Genitourinary: Negative.  Negative for dysuria and urgency.  Musculoskeletal:  Negative for back pain, falls, joint pain, myalgias and neck pain.  Skin: Negative.  Negative for rash.       hyperhidrosis  Neurological: Negative.  Negative for dizziness, tingling, tremors, weakness and headaches.  Endo/Heme/Allergies:  Does not bruise/bleed easily.  Psychiatric/Behavioral: Negative.  Negative  for depression and suicidal ideas. The patient is not nervous/anxious and does not have insomnia.     Physical Exam: Estimated body mass index is 25.23 kg/m as calculated from the following:   Height as of this encounter: 5' 5.25" (1.657 m).   Weight as of this encounter: 152 lb 12.8 oz (69.3 kg). BP 104/64   Pulse 73   Temp 97.7 F (36.5 C)   Ht 5' 5.25" (1.657 m)   Wt 152 lb 12.8 oz (69.3 kg)   LMP 12/24/2022   SpO2 99%   BMI 25.23 kg/m  General Appearance: Well nourished, in no apparent distress.  Eyes: PERRLA, EOMs, conjunctiva no swelling or erythema, normal fundi and vessels.  Sinuses: No Frontal/maxillary tenderness  ENT/Mouth: Ext aud canals clear, normal light reflex with TMs without erythema, bulging. Good dentition. No erythema, swelling, or exudate on post pharynx. Hearing normal.  Neck: Supple, thyroid normal. No bruits  Respiratory: Respiratory effort normal, BS equal bilaterally without rales, rhonchi, wheezing or stridor.  Cardio: RRR without murmurs, rubs or gallops. Brisk peripheral pulses without edema.  Chest:  symmetric, with normal excursions and percussion.  Breasts: Symmetric, without lumps, nipple discharge, retractions.  Abdomen: Soft, nontender, no guarding, rebound, hernias, masses, or organomegaly.  Lymphatics: Non tender without lymphadenopathy.  Pelvic exam: normal external genitalia, vulva, vagina, cervix, uterus and adnexa, PAP: Pap smear done today, thin-prep method. Musculoskeletal: Full ROM all peripheral extremities,5/5 strength, and normal gait.  Skin:  bilateral toes with purple/blue coloring, good cap refill, good pulses and good sensation bilaterally. Clamminess of hands bilaterally Neuro: Cranial nerves intact, reflexes equal bilaterally. Normal muscle tone, no cerebellar symptoms. Sensation intact.  Psych: Awake and oriented X 3, normal affect, Insight and Judgment appropriate.     Alycia Rossetti, NP 15:21 PM [facility]

## 2023-01-03 ENCOUNTER — Encounter: Payer: Self-pay | Admitting: Nurse Practitioner

## 2023-01-03 ENCOUNTER — Other Ambulatory Visit: Payer: Self-pay | Admitting: Nurse Practitioner

## 2023-01-03 ENCOUNTER — Ambulatory Visit (INDEPENDENT_AMBULATORY_CARE_PROVIDER_SITE_OTHER): Payer: BC Managed Care – PPO | Admitting: Nurse Practitioner

## 2023-01-03 VITALS — BP 104/64 | HR 73 | Temp 97.7°F | Ht 65.25 in | Wt 152.8 lb

## 2023-01-03 DIAGNOSIS — Z0000 Encounter for general adult medical examination without abnormal findings: Secondary | ICD-10-CM

## 2023-01-03 DIAGNOSIS — Z1389 Encounter for screening for other disorder: Secondary | ICD-10-CM | POA: Diagnosis not present

## 2023-01-03 DIAGNOSIS — E559 Vitamin D deficiency, unspecified: Secondary | ICD-10-CM

## 2023-01-03 DIAGNOSIS — R7309 Other abnormal glucose: Secondary | ICD-10-CM

## 2023-01-03 DIAGNOSIS — F909 Attention-deficit hyperactivity disorder, unspecified type: Secondary | ICD-10-CM

## 2023-01-03 DIAGNOSIS — Z131 Encounter for screening for diabetes mellitus: Secondary | ICD-10-CM | POA: Diagnosis not present

## 2023-01-03 DIAGNOSIS — Z124 Encounter for screening for malignant neoplasm of cervix: Secondary | ICD-10-CM | POA: Diagnosis not present

## 2023-01-03 DIAGNOSIS — R61 Generalized hyperhidrosis: Secondary | ICD-10-CM

## 2023-01-03 DIAGNOSIS — Z1329 Encounter for screening for other suspected endocrine disorder: Secondary | ICD-10-CM

## 2023-01-03 DIAGNOSIS — R87610 Atypical squamous cells of undetermined significance on cytologic smear of cervix (ASC-US): Secondary | ICD-10-CM | POA: Diagnosis not present

## 2023-01-03 DIAGNOSIS — Z79899 Other long term (current) drug therapy: Secondary | ICD-10-CM

## 2023-01-03 DIAGNOSIS — I7300 Raynaud's syndrome without gangrene: Secondary | ICD-10-CM

## 2023-01-03 DIAGNOSIS — Z13220 Encounter for screening for lipoid disorders: Secondary | ICD-10-CM | POA: Diagnosis not present

## 2023-01-03 DIAGNOSIS — E782 Mixed hyperlipidemia: Secondary | ICD-10-CM

## 2023-01-03 DIAGNOSIS — Z0001 Encounter for general adult medical examination with abnormal findings: Secondary | ICD-10-CM

## 2023-01-03 DIAGNOSIS — E663 Overweight: Secondary | ICD-10-CM

## 2023-01-03 MED ORDER — METHYLPHENIDATE HCL ER (OSM) 36 MG PO TBCR: EXTENDED_RELEASE_TABLET | ORAL | 0 refills | Status: DC

## 2023-01-03 NOTE — Patient Instructions (Signed)

## 2023-01-04 ENCOUNTER — Other Ambulatory Visit: Payer: Self-pay | Admitting: Nurse Practitioner

## 2023-01-04 MED ORDER — NITROFURANTOIN MONOHYD MACRO 100 MG PO CAPS: 100.0000 mg | ORAL_CAPSULE | Freq: Two times a day (BID) | ORAL | 0 refills | Status: AC

## 2023-01-05 ENCOUNTER — Telehealth: Payer: Self-pay | Admitting: Nurse Practitioner

## 2023-01-05 ENCOUNTER — Encounter: Payer: Self-pay | Admitting: Nurse Practitioner

## 2023-01-05 LAB — COMPLETE METABOLIC PANEL WITH GFR
AG Ratio: 1.6 (calc) (ref 1.0–2.5)
ALT: 8 U/L (ref 6–29)
AST: 12 U/L (ref 10–30)
Albumin: 4.3 g/dL (ref 3.6–5.1)
Alkaline phosphatase (APISO): 65 U/L (ref 31–125)
BUN: 11 mg/dL (ref 7–25)
CO2: 28 mmol/L (ref 20–32)
Calcium: 10 mg/dL (ref 8.6–10.2)
Chloride: 104 mmol/L (ref 98–110)
Creat: 0.61 mg/dL (ref 0.50–0.96)
Globulin: 2.7 g/dL (calc) (ref 1.9–3.7)
Glucose, Bld: 86 mg/dL (ref 65–99)
Potassium: 4.7 mmol/L (ref 3.5–5.3)
Sodium: 142 mmol/L (ref 135–146)
Total Bilirubin: 0.3 mg/dL (ref 0.2–1.2)
Total Protein: 7 g/dL (ref 6.1–8.1)
eGFR: 131 mL/min/{1.73_m2} (ref >=60)

## 2023-01-05 LAB — HEMOGLOBIN A1C
Hgb A1c MFr Bld: 5.3 % of total Hgb (ref <5.7)
Mean Plasma Glucose: 105 mg/dL
eAG (mmol/L): 5.8 mmol/L

## 2023-01-05 LAB — LIPID PANEL
Cholesterol: 188 mg/dL (ref <200)
HDL: 74 mg/dL (ref >=50)
LDL Cholesterol (Calc): 94 mg/dL (calc)
Non-HDL Cholesterol (Calc): 114 mg/dL (calc) (ref <130)
Total CHOL/HDL Ratio: 2.5 (calc) (ref <5.0)
Triglycerides: 103 mg/dL (ref <150)

## 2023-01-05 LAB — URINALYSIS W MICROSCOPIC + REFLEX CULTURE
Bilirubin Urine: NEGATIVE
Glucose, UA: NEGATIVE
Hyaline Cast: NONE SEEN /LPF
Ketones, ur: NEGATIVE
Nitrites, Initial: NEGATIVE
Protein, ur: NEGATIVE
RBC / HPF: NONE SEEN /HPF (ref 0–2)
Specific Gravity, Urine: 1.022 (ref 1.001–1.035)
pH: 6.5 (ref 5.0–8.0)

## 2023-01-05 LAB — URINE CULTURE
MICRO NUMBER:: 14457045
SPECIMEN QUALITY:: ADEQUATE

## 2023-01-05 LAB — PAP IG (IMAGE GUIDED)

## 2023-01-05 LAB — CBC WITH DIFFERENTIAL/PLATELET
Absolute Monocytes: 371 cells/uL (ref 200–950)
Basophils Absolute: 13 cells/uL (ref 0–200)
Basophils Relative: 0.2 %
Eosinophils Absolute: 59 cells/uL (ref 15–500)
Eosinophils Relative: 0.9 %
HCT: 36.6 % (ref 35.0–45.0)
Hemoglobin: 12.4 g/dL (ref 11.7–15.5)
Lymphs Abs: 2418 cells/uL (ref 850–3900)
MCH: 28.6 pg (ref 27.0–33.0)
MCHC: 33.9 g/dL (ref 32.0–36.0)
MCV: 84.3 fL (ref 80.0–100.0)
MPV: 10.7 fL (ref 7.5–12.5)
Monocytes Relative: 5.7 %
Neutro Abs: 3640 cells/uL (ref 1500–7800)
Neutrophils Relative %: 56 %
Platelets: 331 10*3/uL (ref 140–400)
RBC: 4.34 10*6/uL (ref 3.80–5.10)
RDW: 13 % (ref 11.0–15.0)
Total Lymphocyte: 37.2 %
WBC: 6.5 10*3/uL (ref 3.8–10.8)

## 2023-01-05 LAB — PAP, TP IMAGING, WNL RFLX HPV

## 2023-01-05 LAB — CULTURE INDICATED

## 2023-01-05 LAB — VITAMIN D 25 HYDROXY (VIT D DEFICIENCY, FRACTURES): Vit D, 25-Hydroxy: 32 ng/mL (ref 30–100)

## 2023-01-05 LAB — MAGNESIUM: Magnesium: 2.1 mg/dL (ref 1.5–2.5)

## 2023-01-05 LAB — TSH: TSH: 1.45 mIU/L

## 2023-01-05 NOTE — Telephone Encounter (Signed)
Records were sent.

## 2023-01-05 NOTE — Telephone Encounter (Signed)
Patient is requesting that pap smear results be sent to her OB-GYN. She see's Laurin Coder at [facility]. Fax number is [phone].

## 2023-01-06 NOTE — Telephone Encounter (Signed)
Will you make sure she is scheduling the appointment

## 2023-01-22 ENCOUNTER — Other Ambulatory Visit: Payer: Self-pay | Admitting: Nurse Practitioner

## 2023-01-22 DIAGNOSIS — F32A Depression, unspecified: Secondary | ICD-10-CM

## 2023-03-26 ENCOUNTER — Other Ambulatory Visit: Payer: Self-pay | Admitting: Nurse Practitioner

## 2023-03-26 DIAGNOSIS — R61 Generalized hyperhidrosis: Secondary | ICD-10-CM

## 2023-03-28 ENCOUNTER — Other Ambulatory Visit: Payer: Self-pay | Admitting: Nurse Practitioner

## 2023-03-28 DIAGNOSIS — F909 Attention-deficit hyperactivity disorder, unspecified type: Secondary | ICD-10-CM

## 2023-03-29 MED ORDER — METHYLPHENIDATE HCL ER (OSM) 36 MG PO TBCR: EXTENDED_RELEASE_TABLET | ORAL | 0 refills | Status: DC

## 2023-04-29 ENCOUNTER — Other Ambulatory Visit: Payer: Self-pay | Admitting: Nurse Practitioner

## 2023-04-29 DIAGNOSIS — T753XXA Motion sickness, initial encounter: Secondary | ICD-10-CM

## 2023-04-29 MED ORDER — SCOPOLAMINE 1 MG/3DAYS TD PT72: 1.0000 | MEDICATED_PATCH | TRANSDERMAL | 1 refills | Status: DC

## 2023-07-19 ENCOUNTER — Other Ambulatory Visit: Payer: Self-pay | Admitting: Nurse Practitioner

## 2023-07-19 DIAGNOSIS — F32A Depression, unspecified: Secondary | ICD-10-CM

## 2023-08-18 ENCOUNTER — Telehealth: Payer: Self-pay | Admitting: Nurse Practitioner

## 2023-08-18 NOTE — Telephone Encounter (Signed)
Refill request on prozac sent to [facility] on [address].

## 2023-09-13 ENCOUNTER — Other Ambulatory Visit: Payer: Self-pay

## 2023-09-13 ENCOUNTER — Encounter: Payer: Self-pay | Admitting: Nurse Practitioner

## 2023-09-13 ENCOUNTER — Ambulatory Visit (INDEPENDENT_AMBULATORY_CARE_PROVIDER_SITE_OTHER): Payer: BC Managed Care – PPO | Admitting: Nurse Practitioner

## 2023-09-13 VITALS — HR 82 | Temp 97.6°F | Ht 65.25 in

## 2023-09-13 DIAGNOSIS — R051 Acute cough: Secondary | ICD-10-CM | POA: Diagnosis not present

## 2023-09-13 DIAGNOSIS — Z1152 Encounter for screening for COVID-19: Secondary | ICD-10-CM

## 2023-09-13 DIAGNOSIS — J069 Acute upper respiratory infection, unspecified: Secondary | ICD-10-CM

## 2023-09-13 LAB — POC COVID19 BINAXNOW: SARS Coronavirus 2 Ag: NEGATIVE

## 2023-09-13 MED ORDER — PROMETHAZINE-DM 6.25-15 MG/5ML PO SYRP
5.0000 mL | ORAL_SOLUTION | Freq: Four times a day (QID) | ORAL | 0 refills | Status: DC | PRN
Start: 2023-09-13 — End: 2024-03-15

## 2023-09-13 MED ORDER — AZITHROMYCIN 250 MG PO TABS
ORAL_TABLET | ORAL | 1 refills | Status: DC
Start: 2023-09-13 — End: 2024-03-15

## 2023-09-13 NOTE — Progress Notes (Signed)
Assessment and Plan:  Teresa Olson was seen today for an episodic visit.  Diagnoses and all order for this visit:  Encounter for screening for COVID-19 Negative  - POC COVID-19  Upper respiratory tract infection, unspecified type Start Z-pak as directed Continue to stay well hydrated to keep mucus thin and productive.  - azithromycin (ZITHROMAX) 250 MG tablet; Take 2 tablets on  Day 1,  followed by 1 tablet  daily for 4 more days    for Sinusitis  /Bronchitis  Dispense: 6 each; Refill: 1  Acute cough Start Promethazine cough syrup as directed Continue to stay well hydrated to keep mucus thin and productive.  - promethazine-dextromethorphan (PROMETHAZINE-DM) 6.25-15 MG/5ML syrup; Take 5 mLs by mouth 4 (four) times daily as needed for cough.  Dispense: 240 mL; Refill: 0  Notify office for further evaluation and treatment, questions or concerns if s/s fail to improve. The risks and benefits of my recommendations, as well as other treatment options were discussed with the patient today. Questions were answered.  Further disposition pending results of labs. Discussed med's effects and SE's.    Over 15 minutes of exam, counseling, chart review, and critical decision making was performed.   Future Appointments  Date Time Provider Department Center  01/10/2024  3:00 PM Raynelle Dick, NP [facility] None    ------------------------------------------------------------------------------------------------------------------   HPI Pulse 82   Temp 97.6 F (36.4 C)   Ht 5' 5.25" (1.657 m)   SpO2 98%   BMI 25.23 kg/m   Patient complains of symptoms of a URI, possible sinusitis. Symptoms include bilateral ear pressure/pain, achiness, congestion, cough described as productive, facial pain, headache described as pressure, nasal congestion, sinus pressure, and sore throat. Onset of symptoms was 2 weeks ago, and has been unchanged since that time. Treatment to date: antihistamines,  cough suppressants, and decongestants.  Denies fever, chills, N/V.  History reviewed. No pertinent past medical history.   Allergies  Allergen Reactions   Wound Dressing Adhesive Rash   Sulfa Antibiotics     Current Outpatient Medications on File Prior to Visit  Medication Sig   BLISOVI 24 FE 1-20 MG-MCG(24) tablet    Cholecalciferol (VITAMIN D3) 125 MCG (5000 UT) CAPS Takes 1 capsule Daily   Cyanocobalamin (VITAMIN B-12) 1000 MCG SUBL Takes 1 tablet SL Daily   FLUoxetine (PROZAC) 20 MG capsule TAKE 1 CAPSULE BY MOUTH EVERY DAY   glycopyrrolate (ROBINUL) 1 MG tablet TAKE 1 TABLET BY MOUTH TWICE A DAY AS NEEDED   methylphenidate (CONCERTA) 36 MG PO CR tablet Take  1 tablet  Daily for ADD - Focus & Concentration                                        /               take                         by                 mouth   ondansetron (ZOFRAN-ODT) 8 MG disintegrating tablet Take 1 tablet (8 mg total) by mouth every 8 (eight) hours as needed for nausea.   promethazine (PHENERGAN) 25 MG tablet Take 1 tablet (25 mg total) by mouth every 8 (eight) hours as needed for refractory nausea / vomiting.   scopolamine (TRANSDERM-SCOP)  1 MG/3DAYS Place 1 patch (1.5 mg total) onto the skin every 3 (three) days.   fexofenadine (ALLEGRA) 180 MG tablet Take 180 mg by mouth daily. (Patient not taking: Reported on 09/13/2023)   No current facility-administered medications on file prior to visit.    ROS: all negative except what is noted in the HPI.   Physical Exam:  Pulse 82   Temp 97.6 F (36.4 C)   Ht 5' 5.25" (1.657 m)   SpO2 98%   BMI 25.23 kg/m   General Appearance: NAD.  Awake, conversant and cooperative. Eyes: PERRLA, EOMs intact.  Sclera white.  Conjunctiva without erythema. Sinuses: No frontal/maxillary tenderness.  No nasal discharge. Nares patent.  ENT/Mouth: Ext aud canals clear.  Bilateral TMs w/DOL and without erythema or bulging. Hearing intact.  Posterior pharynx without swelling or  exudate.  Tonsils without swelling or erythema.  Neck: Supple.  No masses, nodules or thyromegaly. Respiratory: Effort is regular with non-labored breathing. Breath sounds are equal bilaterally without rales, rhonchi, wheezing or stridor.  Cardio: RRR with no MRGs. Brisk peripheral pulses without edema.  Abdomen: Active BS in all four quadrants.  Soft and non-tender without guarding, rebound tenderness, hernias or masses. Lymphatics: Non tender without lymphadenopathy.  Musculoskeletal: Full ROM, 5/5 strength, normal ambulation.  No clubbing or cyanosis. Skin: Appropriate color for ethnicity. Warm without rashes, lesions, ecchymosis, ulcers.  Neuro: CN II-XII grossly normal. Normal muscle tone without cerebellar symptoms and intact sensation.   Psych: AO X 3,  appropriate mood and affect, insight and judgment.     Adela Glimpse, NP 4:52 PM [facility]

## 2023-09-13 NOTE — Patient Instructions (Signed)

## 2023-09-15 DIAGNOSIS — Z3202 Encounter for pregnancy test, result negative: Secondary | ICD-10-CM | POA: Diagnosis not present

## 2023-09-15 DIAGNOSIS — M25521 Pain in right elbow: Secondary | ICD-10-CM | POA: Diagnosis not present

## 2023-09-15 DIAGNOSIS — S5001XA Contusion of right elbow, initial encounter: Secondary | ICD-10-CM | POA: Diagnosis not present

## 2023-11-15 ENCOUNTER — Telehealth: Payer: Self-pay | Admitting: Nurse Practitioner

## 2023-11-15 ENCOUNTER — Other Ambulatory Visit: Payer: Self-pay | Admitting: Nurse Practitioner

## 2023-11-15 DIAGNOSIS — R61 Generalized hyperhidrosis: Secondary | ICD-10-CM

## 2023-11-15 DIAGNOSIS — J302 Other seasonal allergic rhinitis: Secondary | ICD-10-CM

## 2023-11-15 MED ORDER — FEXOFENADINE HCL 180 MG PO TABS
180.0000 mg | ORAL_TABLET | Freq: Every day | ORAL | 5 refills | Status: DC
Start: 2023-11-15 — End: 2024-03-15

## 2023-11-15 MED ORDER — GLYCOPYRROLATE 1 MG PO TABS
1.0000 mg | ORAL_TABLET | Freq: Two times a day (BID) | ORAL | 1 refills | Status: AC | PRN
Start: 2023-11-15 — End: ?

## 2023-11-15 NOTE — Telephone Encounter (Signed)
Refill on Allegra and robinul. Please send to   [id number] [place], [place] - [address]

## 2024-01-10 ENCOUNTER — Encounter: Payer: BC Managed Care – PPO | Admitting: Nurse Practitioner

## 2024-01-16 ENCOUNTER — Encounter: Payer: BC Managed Care – PPO | Admitting: Nurse Practitioner

## 2024-01-29 DIAGNOSIS — R300 Dysuria: Secondary | ICD-10-CM | POA: Diagnosis not present

## 2024-01-29 DIAGNOSIS — N3001 Acute cystitis with hematuria: Secondary | ICD-10-CM | POA: Diagnosis not present

## 2024-02-14 ENCOUNTER — Other Ambulatory Visit: Payer: Self-pay

## 2024-02-14 DIAGNOSIS — F32A Depression, unspecified: Secondary | ICD-10-CM

## 2024-02-14 MED ORDER — FLUOXETINE HCL 20 MG PO CAPS
20.0000 mg | ORAL_CAPSULE | Freq: Every day | ORAL | 0 refills | Status: DC
Start: 2024-02-14 — End: 2024-03-15

## 2024-02-24 DIAGNOSIS — Z118 Encounter for screening for other infectious and parasitic diseases: Secondary | ICD-10-CM | POA: Diagnosis not present

## 2024-02-24 DIAGNOSIS — Z124 Encounter for screening for malignant neoplasm of cervix: Secondary | ICD-10-CM | POA: Diagnosis not present

## 2024-02-24 DIAGNOSIS — Z01419 Encounter for gynecological examination (general) (routine) without abnormal findings: Secondary | ICD-10-CM | POA: Diagnosis not present

## 2024-02-24 DIAGNOSIS — Z1331 Encounter for screening for depression: Secondary | ICD-10-CM | POA: Diagnosis not present

## 2024-03-15 ENCOUNTER — Encounter: Payer: Self-pay | Admitting: Family

## 2024-03-15 ENCOUNTER — Ambulatory Visit: Payer: BC Managed Care – PPO | Admitting: Family

## 2024-03-15 VITALS — BP 108/72 | HR 83 | Temp 98.1°F | Wt 187.2 lb

## 2024-03-15 DIAGNOSIS — R635 Abnormal weight gain: Secondary | ICD-10-CM

## 2024-03-15 DIAGNOSIS — E669 Obesity, unspecified: Secondary | ICD-10-CM | POA: Insufficient documentation

## 2024-03-15 DIAGNOSIS — F419 Anxiety disorder, unspecified: Secondary | ICD-10-CM | POA: Diagnosis not present

## 2024-03-15 DIAGNOSIS — F32A Depression, unspecified: Secondary | ICD-10-CM

## 2024-03-15 DIAGNOSIS — F900 Attention-deficit hyperactivity disorder, predominantly inattentive type: Secondary | ICD-10-CM | POA: Diagnosis not present

## 2024-03-15 DIAGNOSIS — Z6830 Body mass index (BMI) 30.0-30.9, adult: Secondary | ICD-10-CM

## 2024-03-15 MED ORDER — LISDEXAMFETAMINE DIMESYLATE 40 MG PO CAPS: 40.0000 mg | ORAL_CAPSULE | Freq: Every day | ORAL | 0 refills | Status: DC

## 2024-03-15 MED ORDER — FLUOXETINE HCL 10 MG PO TABS: 10.0000 mg | ORAL_TABLET | Freq: Every day | ORAL | 2 refills | Status: DC

## 2024-03-15 MED ORDER — LISDEXAMFETAMINE DIMESYLATE 40 MG PO CAPS
40.0000 mg | ORAL_CAPSULE | Freq: Every day | ORAL | 0 refills | Status: DC
Start: 2024-05-14 — End: 2024-07-27

## 2024-03-15 NOTE — Progress Notes (Signed)
 Patient ID: Teresa Olson, female    DOB: [birth year], 23 y.o.   MRN: [id number]  Chief Complaint  Patient presents with   New Patient (Initial Visit)   ADHD    Medication refill  vyvanse   Obesity   Anxiety and Depression    Pt would like to discuss Anxiety and Depression, Has tried Prozac.   Discussed the use of AI scribe software for clinical note transcription with the patient, who gave verbal consent to proceed.  History of Present Illness Teresa Olson, a full-time student, presents with concerns about her attention deficit disorder (ADD) and depression. She has been previously treated with Vyvanse (70mg ) and Concerta for ADD. She reports that Concerta was ineffective, causing her to feel jittery and unfocused. 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 In addition to ADD, Teresa Olson has been managing depression with Prozac for the past three to four years. She started the medication during a difficult relationship and has been stable on a 20mg  dose. Recently, she has been feeling happier and more productive, leading her to consider reducing her Prozac dosage. Teresa Olson also expresses concern about her weight. Over the past year, she has noticed a steady increase in her weight, despite efforts to eat healthier, limit sugar intake, and exercise regularly. She reports feeling uncomfortable with her body image and might consider medication if her weight continues to rise.  Assessment & Plan Attention-Deficit/Hyperactivity Disorder (ADHD) - ADHD symptoms worsened off medication. Concerta ineffective, Vyvanse previously effective. - Prescribe Vyvanse 40 mg, with 3 refills to hold on file. PDMP checked and verified. - Advise monitoring insurance coverage and cost, let me know if too expensive.  - Consider GoodRx card for cost reduction, paying cash. - F/U in 1  month  Anxiety & Depression - Stable mood on Prozac 20 mg, desires dose reduction. Caution to tapering all the way down due to potential stressors of planning upcoming wedding and finishing college. - Prescribe Prozac 10 mg tablets to have option to go to 15mg  if needed. - Advise tapering to 10 mg and monitor for a month. - Schedule follow-up in 1 month to reassess medication needs.  Weight Gain/Obesity - Gradual weight gain despite healthy lifestyle. Emphasized dietary changes for weight loss. - Advise dietary changes: reduce evening carbohydrates, increase protein and vegetables. - Encourage continued exercise for maintenance and overall health. - wt. Loss strategies reviewed including portion control, less carbs including sweets, eating most of calories earlier in day, drinking 64oz water qd, and establishing daily exercise routine.  - Offer referral to nutritionist or weight management program if no improvement in a few months.    Subjective:    Outpatient Medications Prior to Visit  Medication Sig Dispense Refill   BLISOVI 24 FE 1-20 MG-MCG(24) tablet   9   FLUoxetine (PROZAC) 20 MG capsule Take 1 capsule (20 mg total) by mouth daily. 90 capsule 0   glycopyrrolate (ROBINUL) 1 MG tablet Take 1 tablet (1 mg total) by mouth 2 (two) times daily as needed. 180 tablet 1   Norethindrone Acetate-Ethinyl Estrad-FE (HAILEY 24 FE) 1-20 MG-MCG(24) tablet Take 1 tablet by mouth every morning.     azithromycin (ZITHROMAX) 250 MG tablet Take 2 tablets on  Day 1,  followed by 1 tablet  daily for 4 more days    for Sinusitis  /Bronchitis (Patient not taking: Reported on  03/15/2024) 6 each 1   Cholecalciferol (VITAMIN D3) 125 MCG (5000 UT) CAPS Takes 1 capsule Daily (Patient not taking: Reported on 03/15/2024) 1 capsule 0   Cyanocobalamin (VITAMIN B-12) 1000 MCG SUBL Takes 1 tablet SL Daily (Patient not taking: Reported on 03/15/2024) 1 tablet 0   fexofenadine (ALLEGRA) 180 MG tablet Take 1 tablet (180 mg  total) by mouth daily. (Patient not taking: Reported on 03/15/2024) 30 tablet 5   methylphenidate (CONCERTA) 36 MG PO CR tablet Take  1 tablet  Daily for ADD - Focus & Concentration                                        /               take                         by                 mouth (Patient not taking: Reported on 03/15/2024) 30 tablet 0   ondansetron (ZOFRAN-ODT) 8 MG disintegrating tablet Take 1 tablet (8 mg total) by mouth every 8 (eight) hours as needed for nausea. (Patient not taking: Reported on 03/15/2024) 30 tablet 3   promethazine (PHENERGAN) 25 MG tablet Take 1 tablet (25 mg total) by mouth every 8 (eight) hours as needed for refractory nausea / vomiting. 30 tablet 2   promethazine-dextromethorphan (PROMETHAZINE-DM) 6.25-15 MG/5ML syrup Take 5 mLs by mouth 4 (four) times daily as needed for cough. (Patient not taking: Reported on 03/15/2024) 240 mL 0   scopolamine (TRANSDERM-SCOP) 1 MG/3DAYS Place 1 patch (1.5 mg total) onto the skin every 3 (three) days. (Patient not taking: Reported on 03/15/2024) 10 patch 1   No facility-administered medications prior to visit.   History reviewed. No pertinent past medical history. Past Surgical History:  Procedure Laterality Date   WISDOM TOOTH EXTRACTION     Allergies  Allergen Reactions   Coconut (Cocos Nucifera) Anaphylaxis    Close up and itches   Wound Dressing Adhesive Rash   Sulfa Antibiotics       Objective:    Physical Exam Vitals and nursing note reviewed.  Constitutional:      Appearance: Normal appearance.  Cardiovascular:     Rate and Rhythm: Normal rate and regular rhythm.  Pulmonary:     Effort: Pulmonary effort is normal.     Breath sounds: Normal breath sounds.  Musculoskeletal:        General: Normal range of motion.  Skin:    General: Skin is warm and dry.  Neurological:     Mental Status: She is alert.  Psychiatric:        Mood and Affect: Mood normal.        Behavior: Behavior normal.    BP 108/72   Pulse  83   Temp 98.1 F (36.7 C) (Temporal)   Wt 187 lb 4 oz (84.9 kg)   LMP 03/14/2024 (Exact Date)   SpO2 100%   BMI 30.92 kg/m  Wt Readings from Last 3 Encounters:  03/15/24 187 lb 4 oz (84.9 kg)  01/03/23 152 lb 12.8 oz (69.3 kg)  09/02/22 151 lb 3.2 oz (68.6 kg)       Dulce Sellar, NP

## 2024-03-15 NOTE — Patient Instructions (Addendum)
 Welcome to [facility], It was a pleasure meeting you today!    As discussed, I have sent your refills to your pharmacy.  Please schedule a 1 month follow up visit today.  Start working on what we discussed today regarding your weight loss goals:  1) Eat small portions, ok to eat 6 mini meals vs. 3 big meals if this controls your hunger  better. 2) Eat until full and then STOP! This is your body telling you it has had enough. 3) Drink at least 64 oz water = 2 liters = 8, 8oz cups DAILY. 4) Eat most of your calories earlier in the day (if you work during the day).  Want to eat within a 8-10hour window if possible. No eating after 7pm, only no calorie drinks or water from 7p until bedtime. 5) Look for low carb, high protein foods/recipes. Avoid simple carbs including sweets, white bread, rice, potatoes, pasta. Look for whole grain/whole wheat/or almond flour if eating these foods. 6) High protein foods: meats (limit red meat), including [place], chicken, pork, FISH (best source) nuts, cheese, beans (black beans, soy/edamame beans are best). Protein drinks, bars are also good but must have low sugar, look for < 10 grams. 7) Avoid processed/refined sugar and artificial sweeteners if possible. Stevia, Erythritol, or Monk fruit sweetener are preferred, natural, no calorie sweeteners.  Natural sweeteners like Agave or honey in very small amounts are ok also.    PLEASE NOTE: If you had any LAB tests please let us know if you have not heard back within a few days. You may see your results on MyChart before we have a chance to review them but we will give you a call once they are reviewed by [place]. If we ordered any REFERRALS today, please let us know if you have not heard from their office within the next week.  Let us know through MyChart if you are needing REFILLS, or have your pharmacy send [place] the request. You can also use MyChart to communicate with me or any office  staff.

## 2024-03-15 NOTE — Assessment & Plan Note (Signed)
 Gradual weight gain despite healthy lifestyle. Emphasized dietary changes for weight loss. - Advise dietary changes: reduce evening carbohydrates, increase protein and vegetables. - Encourage continued exercise for maintenance and overall health. - Offer referral to nutritionist or weight management program if no improvement in a few months.

## 2024-03-15 NOTE — Assessment & Plan Note (Signed)
 Stable mood on Prozac 20 mg, desires dose reduction. Caution to tapering all the way down due to potential stressors of planning upcoming wedding and finishing college. - Prescribe Prozac 10 mg tablets to have option to go to 15mg  if needed. - Advise tapering to 10 mg and monitor for a month. - Schedule follow-up in 1 month to reassess medication needs.

## 2024-03-15 NOTE — Assessment & Plan Note (Signed)
 ADHD symptoms worsened off medication. Concerta ineffective, Vyvanse previously effective. - Prescribe Vyvanse 40 mg, with 3 refills to hold on file. PDMP checked and verified. - Advise monitoring insurance coverage and cost, let me know if too expensive.  - Consider GoodRx card for cost reduction, paying cash. - F/U in 1 month

## 2024-04-06 ENCOUNTER — Other Ambulatory Visit: Payer: Self-pay | Admitting: Family

## 2024-04-06 DIAGNOSIS — F419 Anxiety disorder, unspecified: Secondary | ICD-10-CM

## 2024-04-12 ENCOUNTER — Encounter: Payer: Self-pay | Admitting: Family

## 2024-04-12 ENCOUNTER — Ambulatory Visit: Admitting: Family

## 2024-04-12 VITALS — BP 113/75 | HR 72 | Temp 97.7°F | Ht 65.25 in | Wt 176.8 lb

## 2024-04-12 DIAGNOSIS — F900 Attention-deficit hyperactivity disorder, predominantly inattentive type: Secondary | ICD-10-CM

## 2024-04-12 DIAGNOSIS — R61 Generalized hyperhidrosis: Secondary | ICD-10-CM | POA: Diagnosis not present

## 2024-04-12 MED ORDER — LISDEXAMFETAMINE DIMESYLATE 40 MG PO CAPS: 40.0000 mg | ORAL_CAPSULE | Freq: Every day | ORAL | 0 refills | Status: DC

## 2024-04-12 NOTE — Progress Notes (Signed)
 Patient ID: Teresa Olson, female    DOB: 2001/03/28, 23 y.o.   MRN: [id number]  Chief Complaint  Patient presents with   ADHD  Discussed the use of AI scribe software for clinical note transcription with the patient, who gave verbal consent to proceed.  History of Present Illness Teresa G Piper "[name]" is a 23 year old female who presents for a follow-up regarding her medication management.  She is currently taking Vyvanse  40 mg, which she resumed after a brief discontinuation. On the first day of resuming, she experienced twitching, which did not persist. She has previously tolerated this dose well and manages her medication responsibly. She takes glycopyrrolate  for hyperhidrosis, prescribed by a nurse practitioner. She takes it once daily due to dehydration concerns, and it has significantly reduced her sweating, though her hands remain slightly clammy. Her medication regimen also includes birth control [place]y [place]) and fluoxetine  (Prozac ). Her current medications have been beneficial for her academic performance, helping her maintain As and Bs in her classes. 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  Assessment & Plan ADHD Vyvanse  40 mg resumed without issues, beneficial for academic performance. Informed consent discussed. - Ensure controlled substance contract signed today. - Provide three-month Vyvanse  refills. - Schedule 3 month follow-up, ok if virtual.  Hyperhidrosis Glycopyrrolate  1mg  effective for pt symptoms, with only mild hand clamminess. Reduced dose from bid to qd due to dehydration. - Advise maintaining adequate hydration. - F/U in 6 mos or prn   Subjective:    Outpatient Medications Prior to Visit  Medication Sig Dispense Refill   FLUoxetine  (PROZAC ) 10 MG tablet Take 1 tablet (10 mg total) by mouth daily. 30 tablet 2   glycopyrrolate  (ROBINUL ) 1 MG tablet Take 1 tablet (1 mg total) by mouth 2 (two) times daily as needed. (Patient taking differently:  Take 1 mg by mouth 2 (two) times daily as needed (pt takes daily d/t SE).) 180 tablet 1   [START ON 04/14/2024] lisdexamfetamine (VYVANSE ) 40 MG capsule Take 1 capsule (40 mg total) by mouth daily before breakfast. 30 capsule 0   [START ON 05/14/2024] lisdexamfetamine (VYVANSE ) 40 MG capsule Take 1 capsule (40 mg total) by mouth daily before breakfast. 30 capsule 0   Norethindrone Acetate-Ethinyl Estrad-FE (HAILEY 24 FE) 1-20 MG-MCG(24) tablet Take 1 tablet by mouth every morning.     BLISOVI 24 FE 1-20 MG-MCG(24) tablet   9   lisdexamfetamine (VYVANSE ) 40 MG capsule Take 1 capsule (40 mg total) by mouth daily before breakfast. 30 capsule 0   promethazine  (PHENERGAN ) 25 MG tablet Take 1 tablet (25 mg total) by mouth every 8 (eight) hours as needed for refractory nausea / vomiting. 30 tablet 2   No facility-administered medications prior to visit.   Past Medical History:  Diagnosis Date   Medication management 12/14/2018   Overweight with body mass index (BMI) of 25 to 25.9 in adult 06/03/2022   Past Surgical History:  Procedure Laterality Date   WISDOM TOOTH EXTRACTION     Allergies  Allergen Reactions   Coconut (Cocos Nucifera) Anaphylaxis    Close up and itches   Wound Dressing Adhesive Rash   Sulfa Antibiotics       Objective:    Physical Exam Vitals and nursing note reviewed.  Constitutional:      Appearance: Normal appearance.  Cardiovascular:     Rate and Rhythm: Normal rate and regular rhythm.  Pulmonary:     Effort: Pulmonary effort is normal.  Breath sounds: Normal breath sounds.  Musculoskeletal:        General: Normal range of motion.  Skin:    General: Skin is warm and dry.  Neurological:     Mental Status: She is alert.  Psychiatric:        Mood and Affect: Mood normal.        Behavior: Behavior normal.    BP 113/75 (BP Location: Left Arm, Patient Position: Sitting, Cuff Size: Large)   Pulse 72   Temp 97.7 F (36.5 C) (Temporal)   Ht 5' 5.25" (1.657 m)    Wt 176 lb 12.8 oz (80.2 kg)   LMP 03/14/2024 (Exact Date)   SpO2 98%   BMI 29.20 kg/m  Wt Readings from Last 3 Encounters:  04/12/24 176 lb 12.8 oz (80.2 kg)  03/15/24 187 lb 4 oz (84.9 kg)  01/03/23 152 lb 12.8 oz (69.3 kg)      Versa Gore, NP

## 2024-04-12 NOTE — Assessment & Plan Note (Signed)
 Glycopyrrolate  1mg  effective for pt symptoms, with only mild hand clamminess. Reduced dose from bid to qd due to dehydration. - Advise maintaining adequate hydration. - F/U in 6 mos or prn

## 2024-04-12 NOTE — Assessment & Plan Note (Signed)
 Vyvanse  40 mg resumed without issues, beneficial for academic performance. Informed consent discussed. - Ensure controlled substance contract signed today. - Provide three-month Vyvanse  refills. - Schedule 3 month follow-up, ok if virtual.

## 2024-04-16 ENCOUNTER — Encounter: Payer: Self-pay | Admitting: Family

## 2024-05-08 ENCOUNTER — Other Ambulatory Visit: Payer: Self-pay | Admitting: Family

## 2024-05-08 DIAGNOSIS — F419 Anxiety disorder, unspecified: Secondary | ICD-10-CM

## 2024-07-26 ENCOUNTER — Encounter: Payer: Self-pay | Admitting: Family

## 2024-07-27 ENCOUNTER — Telehealth (INDEPENDENT_AMBULATORY_CARE_PROVIDER_SITE_OTHER): Admitting: Family

## 2024-07-27 DIAGNOSIS — F900 Attention-deficit hyperactivity disorder, predominantly inattentive type: Secondary | ICD-10-CM | POA: Diagnosis not present

## 2024-07-27 MED ORDER — LISDEXAMFETAMINE DIMESYLATE 40 MG PO CAPS: 40.0000 mg | ORAL_CAPSULE | Freq: Every day | ORAL | 0 refills | Status: AC

## 2024-07-27 NOTE — Progress Notes (Signed)
 MyChart Video Visit    Virtual Visit via Video Note   This format is felt to be most appropriate for this patient at this time. Physical exam was limited by quality of the video and audio technology used for the visit. CMA was able to get the patient set up on a video visit.  Patient location: Home. Patient and provider in visit Provider location: Office  I discussed the limitations of evaluation and management by telemedicine and the availability of in person appointments. The patient expressed understanding and agreed to proceed.  Visit Date: 07/27/2024  Today's healthcare provider: Lucius Krabbe, NP     Subjective:   Patient ID: Teresa Olson, female    DOB: October 13, 2001, 23 y.o.   MRN: [id number]  Chief Complaint  Patient presents with   ADHD    Medication refill  HPI: ADHD f/u: Medications helping target goals: Vyvanse  40mg  qd Regimen: daily Medication side effects/concerns: none Weight: same Sleep: good Mood changes: no changes, just more stress due to upcoming wedding Tics: none Blood pressure, Weight, Pulse, Behavior reviewed: wnl     Assessment & Plan:  Attention deficit hyperactivity disorder (ADHD), predominantly inattentive type Assessment & Plan: Taking Vyvanse  40 mg qd, tolerating, denies any SE. Feeling a little more stress due to upcoming wedding in 2 months, taking semester off from school. Planning to move to [place] in near future as fiance lives on [employer] [place]. - Sending Vyvanse  40mg  refills, 30d x 3 months. - Schedule 3 month follow-up, in office  Orders: -     Lisdexamfetamine Dimesylate ; Take 1 capsule (40 mg total) by mouth daily before breakfast.  Dispense: 30 capsule; Refill: 0 -     Lisdexamfetamine Dimesylate ; Take 1 capsule (40 mg total) by mouth daily before breakfast.  Dispense: 30 capsule; Refill: 0 -     Lisdexamfetamine Dimesylate ; Take 1 capsule (40 mg total) by mouth daily before breakfast.  Dispense: 30 capsule;  Refill: 0   Past Medical History:  Diagnosis Date   Medication management 12/14/2018   Overweight with body mass index (BMI) of 25 to 25.9 in adult 06/03/2022    Past Surgical History:  Procedure Laterality Date   WISDOM TOOTH EXTRACTION      Outpatient Medications Prior to Visit  Medication Sig Dispense Refill   FLUoxetine  (PROZAC ) 10 MG tablet TAKE 1 TABLET BY MOUTH EVERY DAY 90 tablet 0   glycopyrrolate  (ROBINUL ) 1 MG tablet Take 1 tablet (1 mg total) by mouth 2 (two) times daily as needed. (Patient taking differently: Take 1 mg by mouth 2 (two) times daily as needed (pt takes daily d/t SE).) 180 tablet 1   Norethindrone Acetate-Ethinyl Estrad-FE (HAILEY 24 FE) 1-20 MG-MCG(24) tablet Take 1 tablet by mouth every morning.     lisdexamfetamine (VYVANSE ) 40 MG capsule Take 1 capsule (40 mg total) by mouth daily before breakfast. 30 capsule 0   lisdexamfetamine (VYVANSE ) 40 MG capsule Take 1 capsule (40 mg total) by mouth daily before breakfast. 30 capsule 0   lisdexamfetamine (VYVANSE ) 40 MG capsule Take 1 capsule (40 mg total) by mouth daily before breakfast. 30 capsule 0   No facility-administered medications prior to visit.    Allergies  Allergen Reactions   Coconut (Cocos Nucifera) Anaphylaxis    Close up and itches   Wound Dressing Adhesive Rash   Sulfa Antibiotics       Objective:   Physical Exam Vitals and nursing note reviewed.  Constitutional:      General:  Pt is not in acute distress.    Appearance: Normal appearance.  HENT:     Head: Normocephalic.  Pulmonary:     Effort: No respiratory distress.  Musculoskeletal:     Cervical back: Normal range of motion.  Skin:    General: Skin is dry.     Coloration: Skin is not pale.  Neurological:     Mental Status: Pt is alert and oriented to person, place, and time.  Psychiatric:        Mood and Affect: Mood normal.   There were no vitals taken for this visit.  Wt Readings from Last 3 Encounters:  04/12/24  176 lb 12.8 oz (80.2 kg)  03/15/24 187 lb 4 oz (84.9 kg)  01/03/23 152 lb 12.8 oz (69.3 kg)      I discussed the assessment and treatment plan with the patient. The patient was provided an opportunity to ask questions and all were answered. The patient agreed with the plan and demonstrated an understanding of the instructions.   The patient was advised to call back or seek an in-person evaluation if the symptoms worsen or if the condition fails to improve as anticipated.  Lucius Krabbe, NP [facility] [phone] (phone) [phone] (fax)  [facility]

## 2024-07-27 NOTE — Assessment & Plan Note (Signed)
 Taking Vyvanse  40 mg qd, tolerating, denies any SE. Feeling a little more stress due to upcoming wedding in 2 months, taking semester off from school. Planning to move to [place] in near future as fiance lives [place] [employer] [place]. - Sending Vyvanse  40mg  refills, 30d x 3 months. - Schedule 3 month follow-up, in office

## 2024-08-07 ENCOUNTER — Other Ambulatory Visit: Payer: Self-pay | Admitting: Family

## 2024-08-07 DIAGNOSIS — F32A Depression, unspecified: Secondary | ICD-10-CM

## 2024-08-12 ENCOUNTER — Other Ambulatory Visit: Payer: Self-pay | Admitting: Nurse Practitioner

## 2024-08-12 DIAGNOSIS — R61 Generalized hyperhidrosis: Secondary | ICD-10-CM

## 2024-09-23 DIAGNOSIS — Z882 Allergy status to sulfonamides status: Secondary | ICD-10-CM | POA: Diagnosis not present

## 2024-09-23 DIAGNOSIS — M542 Cervicalgia: Secondary | ICD-10-CM | POA: Diagnosis not present

## 2024-09-23 DIAGNOSIS — G8911 Acute pain due to trauma: Secondary | ICD-10-CM | POA: Diagnosis not present

## 2024-09-23 DIAGNOSIS — Z3202 Encounter for pregnancy test, result negative: Secondary | ICD-10-CM | POA: Diagnosis not present

## 2024-11-06 ENCOUNTER — Other Ambulatory Visit: Payer: Self-pay | Admitting: Family

## 2024-11-06 DIAGNOSIS — F419 Anxiety disorder, unspecified: Secondary | ICD-10-CM
